# Patient Record
Sex: Female | Born: 1965 | Race: Black or African American | Hispanic: No | Marital: Married | Smoking: Never smoker
Health system: Southern US, Community
[De-identification: ages and names within clinical notes are randomized; demographics above are authoritative.]

## PROBLEM LIST (undated history)

## (undated) DIAGNOSIS — K5909 Other constipation: Secondary | ICD-10-CM

## (undated) DIAGNOSIS — J45909 Unspecified asthma, uncomplicated: Secondary | ICD-10-CM

## (undated) HISTORY — PX: ABDOMINAL HYSTERECTOMY: SHX81

---

## 2021-01-03 ENCOUNTER — Ambulatory Visit (HOSPITAL_COMMUNITY)
Admission: EM | Admit: 2021-01-03 | Discharge: 2021-01-03 | Disposition: A | Discharge status: Home / Self Care | Payer: 59 | Attending: Emergency Medicine | Admitting: Emergency Medicine

## 2021-01-03 ENCOUNTER — Emergency Department (HOSPITAL_COMMUNITY): Payer: 59 | Admitting: Anesthesiology

## 2021-01-03 ENCOUNTER — Encounter (HOSPITAL_COMMUNITY): Payer: Self-pay

## 2021-01-03 ENCOUNTER — Other Ambulatory Visit: Payer: Self-pay

## 2021-01-03 ENCOUNTER — Emergency Department (HOSPITAL_COMMUNITY): Payer: 59

## 2021-01-03 ENCOUNTER — Encounter (HOSPITAL_COMMUNITY)
Admission: EM | Disposition: A | Discharge status: Home / Self Care | Payer: Self-pay | Source: Home / Self Care | Attending: Emergency Medicine

## 2021-01-03 DIAGNOSIS — K5900 Constipation, unspecified: Secondary | ICD-10-CM | POA: Insufficient documentation

## 2021-01-03 DIAGNOSIS — K8012 Calculus of gallbladder with acute and chronic cholecystitis without obstruction: Secondary | ICD-10-CM | POA: Insufficient documentation

## 2021-01-03 DIAGNOSIS — R1011 Right upper quadrant pain: Secondary | ICD-10-CM

## 2021-01-03 DIAGNOSIS — Z79899 Other long term (current) drug therapy: Secondary | ICD-10-CM | POA: Insufficient documentation

## 2021-01-03 DIAGNOSIS — J45909 Unspecified asthma, uncomplicated: Secondary | ICD-10-CM | POA: Insufficient documentation

## 2021-01-03 DIAGNOSIS — K8000 Calculus of gallbladder with acute cholecystitis without obstruction: Secondary | ICD-10-CM | POA: Insufficient documentation

## 2021-01-03 DIAGNOSIS — Z20822 Contact with and (suspected) exposure to covid-19: Secondary | ICD-10-CM | POA: Diagnosis not present

## 2021-01-03 HISTORY — PX: CHOLECYSTECTOMY: SHX55

## 2021-01-03 HISTORY — DX: Unspecified asthma, uncomplicated: J45.909

## 2021-01-03 HISTORY — DX: Other constipation: K59.09

## 2021-01-03 LAB — URINALYSIS, ROUTINE W REFLEX MICROSCOPIC
Bilirubin Urine: NEGATIVE
Glucose, UA: NEGATIVE mg/dL
Ketones, ur: NEGATIVE mg/dL
Leukocytes,Ua: NEGATIVE
Nitrite: NEGATIVE
Protein, ur: NEGATIVE mg/dL
Specific Gravity, Urine: 1.015 (ref 1.005–1.030)
pH: 7 (ref 5.0–8.0)

## 2021-01-03 LAB — COMPREHENSIVE METABOLIC PANEL
ALT: 15 U/L (ref 0–44)
AST: 17 U/L (ref 15–41)
Albumin: 4.1 g/dL (ref 3.5–5.0)
Alkaline Phosphatase: 136 U/L — ABNORMAL HIGH (ref 38–126)
Anion gap: 8 (ref 5–15)
BUN: 17 mg/dL (ref 6–20)
CO2: 26 mmol/L (ref 22–32)
Calcium: 8.9 mg/dL (ref 8.9–10.3)
Chloride: 102 mmol/L (ref 98–111)
Creatinine, Ser: 0.62 mg/dL (ref 0.44–1.00)
GFR, Estimated: >60 mL/min (ref >60)
Glucose, Bld: 144 mg/dL — ABNORMAL HIGH (ref 70–99)
Potassium: 3.7 mmol/L (ref 3.5–5.1)
Sodium: 136 mmol/L (ref 135–145)
Total Bilirubin: 0.4 mg/dL (ref 0.3–1.2)
Total Protein: 8 g/dL (ref 6.5–8.1)

## 2021-01-03 LAB — CBC WITH DIFFERENTIAL/PLATELET
Abs Immature Granulocytes: 0.1 10*3/uL — ABNORMAL HIGH (ref 0.00–0.07)
Basophils Absolute: 0.1 10*3/uL (ref 0.0–0.1)
Basophils Relative: 1 %
Eosinophils Absolute: 0.2 10*3/uL (ref 0.0–0.5)
Eosinophils Relative: 1 %
HCT: 41.2 % (ref 36.0–46.0)
Hemoglobin: 13.5 g/dL (ref 12.0–15.0)
Immature Granulocytes: 1 %
Lymphocytes Relative: 18 %
Lymphs Abs: 2.9 10*3/uL (ref 0.7–4.0)
MCH: 30.1 pg (ref 26.0–34.0)
MCHC: 32.8 g/dL (ref 30.0–36.0)
MCV: 92 fL (ref 80.0–100.0)
Monocytes Absolute: 0.7 10*3/uL (ref 0.1–1.0)
Monocytes Relative: 5 %
Neutro Abs: 11.9 10*3/uL — ABNORMAL HIGH (ref 1.7–7.7)
Neutrophils Relative %: 74 %
Platelets: 315 10*3/uL (ref 150–400)
RBC: 4.48 MIL/uL (ref 3.87–5.11)
RDW: 12.6 % (ref 11.5–15.5)
WBC: 15.9 10*3/uL — ABNORMAL HIGH (ref 4.0–10.5)
nRBC: 0 % (ref 0.0–0.2)

## 2021-01-03 LAB — RESP PANEL BY RT-PCR (FLU A&B, COVID) ARPGX2
Influenza A by PCR: NEGATIVE
Influenza B by PCR: NEGATIVE
SARS Coronavirus 2 by RT PCR: NEGATIVE

## 2021-01-03 LAB — D-DIMER, QUANTITATIVE: D-Dimer, Quant: 0.49 ug/mL-FEU (ref 0.00–0.50)

## 2021-01-03 LAB — LIPASE, BLOOD: Lipase: 30 U/L (ref 11–51)

## 2021-01-03 LAB — LACTIC ACID, PLASMA: Lactic Acid, Venous: 1.7 mmol/L (ref 0.5–1.9)

## 2021-01-03 SURGERY — LAPAROSCOPIC CHOLECYSTECTOMY
Anesthesia: General

## 2021-01-03 MED ORDER — MIDAZOLAM HCL 5 MG/5ML IJ SOLN
INTRAMUSCULAR | Status: DC | PRN
  Administered 2021-01-03: 2 mg via INTRAVENOUS

## 2021-01-03 MED ORDER — OXYCODONE HCL 5 MG PO TABS: 5.0000 mg | ORAL_TABLET | ORAL | 0 refills | Status: DC | PRN

## 2021-01-03 MED ORDER — SODIUM CHLORIDE 0.9 % IR SOLN
Status: DC | PRN
  Administered 2021-01-03: 1000 mL

## 2021-01-03 MED ORDER — FENTANYL CITRATE (PF) 100 MCG/2ML IJ SOLN
50.0000 ug | Freq: Once | INTRAMUSCULAR | Status: AC
  Administered 2021-01-03: 50 ug via INTRAVENOUS
  Filled 2021-01-03: qty 2

## 2021-01-03 MED ORDER — ONDANSETRON HCL 4 MG/2ML IJ SOLN
INTRAMUSCULAR | Status: DC | PRN
  Administered 2021-01-03: 4 mg via INTRAVENOUS

## 2021-01-03 MED ORDER — SUCCINYLCHOLINE CHLORIDE 200 MG/10ML IV SOSY
PREFILLED_SYRINGE | INTRAVENOUS | Status: AC
  Filled 2021-01-03: qty 10

## 2021-01-03 MED ORDER — DEXAMETHASONE SODIUM PHOSPHATE 10 MG/ML IJ SOLN
INTRAMUSCULAR | Status: DC | PRN
  Administered 2021-01-03: 10 mg via INTRAVENOUS

## 2021-01-03 MED ORDER — BUPIVACAINE HCL (PF) 0.5 % IJ SOLN
INTRAMUSCULAR | Status: DC | PRN
  Administered 2021-01-03: 20 mL

## 2021-01-03 MED ORDER — SUCCINYLCHOLINE CHLORIDE 200 MG/10ML IV SOSY
PREFILLED_SYRINGE | INTRAVENOUS | Status: DC | PRN
  Administered 2021-01-03: 120 mg via INTRAVENOUS

## 2021-01-03 MED ORDER — SODIUM CHLORIDE 0.9 % IV BOLUS
1000.0000 mL | Freq: Once | INTRAVENOUS | Status: AC
  Administered 2021-01-03: 1000 mL via INTRAVENOUS

## 2021-01-03 MED ORDER — HEMOSTATIC AGENTS (NO CHARGE) OPTIME
TOPICAL | Status: DC | PRN
  Administered 2021-01-03: 1 via TOPICAL

## 2021-01-03 MED ORDER — ONDANSETRON HCL 4 MG/2ML IJ SOLN
4.0000 mg | Freq: Once | INTRAMUSCULAR | Status: AC | PRN
  Administered 2021-01-03: 4 mg via INTRAVENOUS
  Filled 2021-01-03: qty 2

## 2021-01-03 MED ORDER — HYDROMORPHONE HCL 1 MG/ML IJ SOLN
1.0000 mg | Freq: Once | INTRAMUSCULAR | Status: AC
  Administered 2021-01-03: 1 mg via INTRAVENOUS
  Filled 2021-01-03: qty 1

## 2021-01-03 MED ORDER — PROPOFOL 10 MG/ML IV BOLUS
INTRAVENOUS | Status: AC
  Filled 2021-01-03: qty 20

## 2021-01-03 MED ORDER — LIDOCAINE HCL (CARDIAC) PF 100 MG/5ML IV SOSY
PREFILLED_SYRINGE | INTRAVENOUS | Status: DC | PRN
  Administered 2021-01-03: 100 mg via INTRATRACHEAL

## 2021-01-03 MED ORDER — PROPOFOL 10 MG/ML IV BOLUS
INTRAVENOUS | Status: DC | PRN
  Administered 2021-01-03: 150 mg via INTRAVENOUS

## 2021-01-03 MED ORDER — SODIUM CHLORIDE 0.9 % IV SOLN
2.0000 g | INTRAVENOUS | Status: AC
  Administered 2021-01-03: 2 g via INTRAVENOUS
  Filled 2021-01-03: qty 2

## 2021-01-03 MED ORDER — ONDANSETRON HCL 4 MG PO TABS: 4.0000 mg | ORAL_TABLET | Freq: Three times a day (TID) | ORAL | 1 refills | Status: DC | PRN

## 2021-01-03 MED ORDER — FENTANYL CITRATE (PF) 100 MCG/2ML IJ SOLN
INTRAMUSCULAR | Status: AC
  Filled 2021-01-03: qty 6

## 2021-01-03 MED ORDER — BUPIVACAINE HCL (PF) 0.5 % IJ SOLN
INTRAMUSCULAR | Status: AC
  Filled 2021-01-03: qty 30

## 2021-01-03 MED ORDER — LIDOCAINE HCL (PF) 2 % IJ SOLN
INTRAMUSCULAR | Status: AC
  Filled 2021-01-03: qty 5

## 2021-01-03 MED ORDER — SUGAMMADEX SODIUM 200 MG/2ML IV SOLN
INTRAVENOUS | Status: DC | PRN
  Administered 2021-01-03: 350 mg via INTRAVENOUS

## 2021-01-03 MED ORDER — SODIUM CHLORIDE 0.9 % IV SOLN
INTRAVENOUS | Status: AC
  Filled 2021-01-03: qty 2

## 2021-01-03 MED ORDER — ROCURONIUM BROMIDE 10 MG/ML (PF) SYRINGE
PREFILLED_SYRINGE | INTRAVENOUS | Status: DC | PRN
  Administered 2021-01-03: 40 mg via INTRAVENOUS

## 2021-01-03 MED ORDER — ONDANSETRON HCL 4 MG/2ML IJ SOLN
4.0000 mg | Freq: Once | INTRAMUSCULAR | Status: AC
  Administered 2021-01-03: 4 mg via INTRAVENOUS
  Filled 2021-01-03: qty 2

## 2021-01-03 MED ORDER — DEXAMETHASONE SODIUM PHOSPHATE 10 MG/ML IJ SOLN
INTRAMUSCULAR | Status: AC
  Filled 2021-01-03: qty 1

## 2021-01-03 MED ORDER — ONDANSETRON HCL 4 MG/2ML IJ SOLN
INTRAMUSCULAR | Status: AC
  Filled 2021-01-03: qty 2

## 2021-01-03 MED ORDER — LACTATED RINGERS IV SOLN: INTRAVENOUS | Status: DC | PRN

## 2021-01-03 MED ORDER — CHLORHEXIDINE GLUCONATE CLOTH 2 % EX PADS: 6.0000 | MEDICATED_PAD | Freq: Once | CUTANEOUS | Status: DC

## 2021-01-03 MED ORDER — ROCURONIUM BROMIDE 10 MG/ML (PF) SYRINGE
PREFILLED_SYRINGE | INTRAVENOUS | Status: AC
  Filled 2021-01-03: qty 10

## 2021-01-03 MED ORDER — GLYCOPYRROLATE PF 0.2 MG/ML IJ SOSY
PREFILLED_SYRINGE | INTRAMUSCULAR | Status: DC | PRN
  Administered 2021-01-03: .2 mg via INTRAVENOUS

## 2021-01-03 MED ORDER — MIDAZOLAM HCL 2 MG/2ML IJ SOLN
INTRAMUSCULAR | Status: AC
  Filled 2021-01-03: qty 2

## 2021-01-03 MED ORDER — MEPERIDINE HCL 50 MG/ML IJ SOLN: 6.2500 mg | INTRAMUSCULAR | Status: DC | PRN

## 2021-01-03 MED ORDER — HYDROMORPHONE HCL 1 MG/ML IJ SOLN: 0.2500 mg | INTRAMUSCULAR | Status: DC | PRN

## 2021-01-03 MED ORDER — FENTANYL CITRATE (PF) 100 MCG/2ML IJ SOLN
INTRAMUSCULAR | Status: DC | PRN
  Administered 2021-01-03 (×4): 50 ug via INTRAVENOUS

## 2021-01-03 MED ORDER — KETOROLAC TROMETHAMINE 30 MG/ML IJ SOLN
30.0000 mg | Freq: Once | INTRAMUSCULAR | Status: AC
  Administered 2021-01-03: 30 mg via INTRAVENOUS
  Filled 2021-01-03: qty 1

## 2021-01-03 SURGICAL SUPPLY — 40 items
APPLICATOR ARISTA FLEXITIP XL (MISCELLANEOUS) ×2 IMPLANT
APPLIER CLIP ROT 10 11.4 M/L (STAPLE) ×2
BAG RETRIEVAL 10 (BASKET) ×1
BLADE SURG 15 STRL LF DISP TIS (BLADE) ×1 IMPLANT
BLADE SURG 15 STRL SS (BLADE) ×1
CHLORAPREP W/TINT 26 (MISCELLANEOUS) ×2 IMPLANT
CLIP APPLIE ROT 10 11.4 M/L (STAPLE) ×1 IMPLANT
CLOTH BEACON ORANGE TIMEOUT ST (SAFETY) ×2 IMPLANT
COVER LIGHT HANDLE STERIS (MISCELLANEOUS) ×4 IMPLANT
COVER WAND RF STERILE (DRAPES) ×2 IMPLANT
DECANTER SPIKE VIAL GLASS SM (MISCELLANEOUS) ×2 IMPLANT
DERMABOND ADVANCED (GAUZE/BANDAGES/DRESSINGS) ×1
DERMABOND ADVANCED .7 DNX12 (GAUZE/BANDAGES/DRESSINGS) ×1 IMPLANT
ELECT REM PT RETURN 9FT ADLT (ELECTROSURGICAL) ×2
ELECTRODE REM PT RTRN 9FT ADLT (ELECTROSURGICAL) ×1 IMPLANT
GLOVE SURG ENC MOIS LTX SZ6.5 (GLOVE) ×4 IMPLANT
GLOVE SURG UNDER POLY LF SZ6.5 (GLOVE) ×6 IMPLANT
GLOVE SURG UNDER POLY LF SZ7 (GLOVE) ×6 IMPLANT
GOWN STRL REUS W/TWL LRG LVL3 (GOWN DISPOSABLE) ×6 IMPLANT
HEMOSTAT ARISTA ABSORB 3G PWDR (HEMOSTASIS) ×2 IMPLANT
HEMOSTAT SNOW SURGICEL 2X4 (HEMOSTASIS) ×2 IMPLANT
INST SET LAPROSCOPIC AP (KITS) ×2 IMPLANT
KIT TURNOVER KIT A (KITS) ×2 IMPLANT
MANIFOLD NEPTUNE II (INSTRUMENTS) ×2 IMPLANT
NEEDLE INSUFFLATION 14GA 120MM (NEEDLE) ×2 IMPLANT
NS IRRIG 1000ML POUR BTL (IV SOLUTION) ×2 IMPLANT
PACK LAP CHOLE LZT030E (CUSTOM PROCEDURE TRAY) ×2 IMPLANT
PAD ARMBOARD 7.5X6 YLW CONV (MISCELLANEOUS) ×2 IMPLANT
SET BASIN LINEN APH (SET/KITS/TRAYS/PACK) ×2 IMPLANT
SET TUBE SMOKE EVAC HIGH FLOW (TUBING) ×2 IMPLANT
SLEEVE ENDOPATH XCEL 5M (ENDOMECHANICALS) ×2 IMPLANT
SUT MNCRL AB 4-0 PS2 18 (SUTURE) ×4 IMPLANT
SUT VICRYL 0 UR6 27IN ABS (SUTURE) ×2 IMPLANT
SYS BAG RETRIEVAL 10MM (BASKET) ×1
SYSTEM BAG RETRIEVAL 10MM (BASKET) ×1 IMPLANT
TROCAR ENDO BLADELESS 11MM (ENDOMECHANICALS) ×2 IMPLANT
TROCAR XCEL NON-BLD 5MMX100MML (ENDOMECHANICALS) ×2 IMPLANT
TROCAR XCEL UNIV SLVE 11M 100M (ENDOMECHANICALS) ×2 IMPLANT
TUBE CONNECTING 12X1/4 (SUCTIONS) ×2 IMPLANT
WARMER LAPAROSCOPE (MISCELLANEOUS) ×2 IMPLANT

## 2021-01-03 NOTE — Discharge Instructions (Signed)
Discharge Laparoscopic Surgery Instructions:  Common Complaints: Right shoulder pain is common after laparoscopic surgery. This is secondary to the gas used in the surgery being trapped under the diaphragm.  Walk to help your body absorb the gas. This will improve in a few days. Pain at the port sites are common, especially the larger port sites. This will improve with time.  Some nausea is common and poor appetite. The main goal is to stay hydrated the first few days after surgery.   Diet/ Activity: Diet as tolerated. You may not have an appetite, but it is important to stay hydrated. Drink 64 ounces of water a day. Your appetite will return with time.  Shower per your regular routine daily.  Do not take hot showers. Take warm showers that are less than 10 minutes. Rest and listen to your body, but do not remain in bed all day.  Walk everyday for at least 15-20 minutes. Deep cough and move around every 1-2 hours in the first few days after surgery.  Do not lift > 10 lbs, perform excessive bending, pushing, pulling, squatting for 1-2 weeks after surgery.  Do not pick at the dermabond glue on your incision sites.  This glue film will remain in place for 1-2 weeks and will start to peel off.  Do not place lotions or balms on your incision unless instructed to specifically by Dr. Ladale Sherburn.   Pain Expectations and Narcotics: -After surgery you will have pain associated with your incisions and this is normal. The pain is muscular and nerve pain, and will get better with time. -You are encouraged and expected to take non narcotic medications like tylenol and ibuprofen (when able) to treat pain as multiple modalities can aid with pain treatment. -Narcotics are only used when pain is severe or there is breakthrough pain. -You are not expected to have a pain score of 0 after surgery, as we cannot prevent pain. A pain score of 3-4 that allows you to be functional, move, walk, and tolerate some activity is  the goal. The pain will continue to improve over the days after surgery and is dependent on your surgery. -Due to [place] law, we are only able to give a certain amount of pain medication to treat post operative pain, and we only give additional narcotics on a patient by patient basis.  -For most laparoscopic surgery, studies have shown that the majority of patients only need 10-15 narcotic pills, and for open surgeries most patients only need 15-20.   -Having appropriate expectations of pain and knowledge of pain management with non narcotics is important as we do not want anyone to become addicted to narcotic pain medication.  -Using ice packs in the first 48 hours and heating pads after 48 hours, wearing an abdominal binder (when recommended), and using over the counter medications are all ways to help with pain management.   -Simple acts like meditation and mindfulness practices after surgery can also help with pain control and research has proven the benefit of these practices.  Medication: Take tylenol and ibuprofen as needed for pain control, alternating every 4-6 hours.  Example:  Tylenol 1000mg @ 6am, 12noon, 6pm, 12midnight (Do not exceed 4000mg of tylenol a day). Ibuprofen 800mg @ 9am, 3pm, 9pm, 3am (Do not exceed 3600mg of ibuprofen a day).  Take Roxicodone for breakthrough pain every 4 hours.  Take Colace for constipation related to narcotic pain medication. If you do not have a bowel movement in 2 days, take Miralax   over the counter.  Drink plenty of water to also prevent constipation.   Contact Information: If you have questions or concerns, please call our office, [phone], Monday- Thursday 8AM-5PM and Friday 8AM-12Noon.  If it is after hours or on the weekend, please call [facility], [phone], [phone], and ask to speak to the surgeon on call for Dr. Henreitta Leber at [facility].    Minimally Invasive Cholecystectomy, Care After This sheet gives you information about how  to care for yourself after your procedure. Your doctor may also give you more specific instructions. If you have problems or questions, contact your doctor. What can I expect after the procedure? After the procedure, it is common:  To have pain at the areas of surgery. You will be given medicines for pain.  To vomit or feel like you may vomit.  To feel fullness in the belly (bloating) or to have pain in the shoulder. This comes from the gas that was used during the surgery. Follow these instructions at home: Medicines  Take over-the-counter and prescription medicines only as told by your doctor.  If you were prescribed an antibiotic medicine, take it as told by your doctor. Do not stop using the antibiotic even if you start to feel better.  Ask your doctor if the medicine prescribed to you: ? Requires you to avoid driving or using machinery. ? Can cause trouble pooping (constipation). You may need to take these actions to prevent or treat trouble pooping:  Drink enough fluid to keep your pee (urine) pale yellow.  Take over-the-counter or prescription medicines.  Eat foods that are high in fiber. These include beans, whole grains, and fresh fruits and vegetables.  Limit foods that are high in fat and sugar. These include fried or sweet foods. Incision care  Follow instructions from your doctor about how to take care of your cuts from surgery (incisions). Make sure you: ? Wash your hands with soap and water for at least 20 seconds before and after you change your bandage (dressing). If you cannot use soap and water, use hand sanitizer. ? Change your bandage as told by your doctor. ? Leave stitches (sutures), skin glue, or skin tape (adhesive) strips in place. They may need to stay in place for 2 weeks or longer. If tape strips get loose and curl up, you may trim the loose edges. Do not remove tape strips completely unless your doctor says it is okay.  Do not take baths, swim, or use a  hot tub until your doctor approves. You may shower.  Check your surgery area every day for signs of infection. Check for: ? More redness, swelling, or pain. ? Fluid or blood. ? Warmth. ? Pus or a bad smell.   Activity  Rest as told by your doctor.  Do not sit for a long time without moving. Get up to take short walks every 1-2 hours. This is important. Ask for help if you feel weak or unsteady.  Do not lift anything that is heavier than 10 lb (4.5 kg), or the limit that you are told, until your doctor says that it is safe.  Do not play contact sports until your doctor says it is okay.  Do not return to work or school until your doctor says it is okay.  Return to your normal activities as told by your doctor. Ask your doctor what activities are safe for you. General instructions  If you were given a medicine to help you relax (sedative)  during your procedure, it can affect you for many hours. Do not drive or use machinery until your doctor says that it is safe.  Keep all follow-up visits as told by your doctor. This is important. Contact a doctor if:  You get a rash.  You have more redness, swelling, or pain around your cuts from surgery.  You have fluid or blood coming from your cuts from surgery.  Your cuts from surgery feel warm to the touch.  You have pus or a bad smell coming from your cuts from surgery.  You have a fever.  One or more of your cuts from surgery breaks open. Get help right away if:  You have trouble breathing.  You have chest pain.  You have pain that is getting worse in your shoulders.  You faint or feel dizzy when you stand.  You have very bad pain in your belly (abdomen).  You feel like you may vomit or you vomit, and this lasts for more than one day.  You have leg pain. Summary  After your surgery, it is common to have pain at the areas of surgery. You may also have vomiting or fullness in the belly.  Follow your doctor's instructions  about medicine, activity restrictions, and caring for your surgery areas. Do not do activities that require a lot of effort.  Contact a doctor if you have a fever or other signs of infection, such as more redness, swelling, or pain around the cuts from surgery.  Get help right away if you have chest pain, increasing pain in the shoulders, or trouble breathing. This information is not intended to replace advice given to you by your health care provider. Make sure you discuss any questions you have with your health care provider. Document Revised: 07/09/2019 Document Reviewed: 07/09/2019 Elsevier Patient Education  2021 [employer].

## 2021-01-03 NOTE — Anesthesia Procedure Notes (Signed)
Procedure Name: Intubation Date/Time: 01/03/2021 1:31 PM Performed by: Denese Killings, MD Pre-anesthesia Checklist: Patient identified, Emergency Drugs available, Suction available and Patient being monitored Patient Re-evaluated:Patient Re-evaluated prior to induction Oxygen Delivery Method: Circle system utilized Preoxygenation: Pre-oxygenation with 100% oxygen Induction Type: IV induction and Rapid sequence Laryngoscope Size: Mac and 3 Grade View: Grade II Tube type: Oral Tube size: 7.0 mm Number of attempts: 1 Airway Equipment and Method: Stylet and Oral airway Placement Confirmation: ETT inserted through vocal cords under direct vision,  positive ETCO2 and breath sounds checked- equal and bilateral Secured at: 22 cm Tube secured with: Tape Dental Injury: Teeth and Oropharynx as per pre-operative assessment  Comments: Looks like she has enlarged tonsils.

## 2021-01-03 NOTE — Transfer of Care (Signed)
Immediate Anesthesia Transfer of Care Note  Patient: Laurie Brooks  Procedure(s) Performed: LAPAROSCOPIC CHOLECYSTECTOMY (N/A )  Patient Location: PACU  Anesthesia Type:General  Level of Consciousness: awake, alert , oriented and sedated  Airway & Oxygen Therapy: Patient Spontanous Breathing and Patient connected to face mask oxygen  Post-op Assessment: Report given to RN and Post -op Vital signs reviewed and stable  Post vital signs: Reviewed and stable  Last Vitals:  Vitals Value Taken Time  BP 124/73 01/03/21 1437  Temp 36.4 C 01/03/21 1436  Pulse 92 01/03/21 1442  Resp 15 01/03/21 1442  SpO2 100 % 01/03/21 1442  Vitals shown include unvalidated device data.  Last Pain:  Vitals:   01/03/21 1054  TempSrc:   PainSc: Asleep         Complications: No complications documented.

## 2021-01-03 NOTE — Op Note (Signed)
Operative Note   Preoperative Diagnosis: Gallstones with acute cholecystitis    Postoperative Diagnosis: Same   Procedure(s) Performed: Laparoscopic cholecystectomy   Surgeon: Lillia Abed C. Henreitta Leber, MD   Assistants: No qualified resident was available   Anesthesia: General endotracheal   Anesthesiologist: Molli Barrows, MD    Specimens: Gallbladder    Estimated Blood Loss: Minimal    Blood Replacement: None    Complications: None    Operative Findings: Distended gallbladder with edema and inflammation consistent with cholecystitis    Procedure: The patient was taken to the operating room and placed supine. General endotracheal anesthesia was induced. Intravenous antibiotics were administered per protocol. An orogastric tube positioned to decompress the stomach. The abdomen was prepared and draped in the usual sterile fashion.    A supraumbilical incision was made and a Veress technique was utilized to achieve pneumoperitoneum to 15 mmHg with carbon dioxide. A 11 mm optiview port was placed through the supraumbilical region, and a 10 mm 0-degree operative laparoscope was introduced. The area underlying the trocar and Veress needle were inspected and without evidence of injury.  Remaining trocars were placed under direct vision. Two 5 mm ports were placed in the right abdomen, between the anterior axillary and midclavicular line.  A final 11 mm port was placed through the mid-epigastrium, near the falciform ligament.    The gallbladder fundus was elevated cephalad and the infundibulum was retracted to the patient's right. The gallbladder/cystic duct junction was skeletonized. The cystic artery noted in the triangle of Calot and was also skeletonized.  We then continued liberal medial and lateral dissection until the critical view of safety was achieved.    The cystic duct and cystic artery were triply clipped and divided. The gallbladder was then dissected from the liver bed with  electrocautery. The specimen was placed in an Endopouch and was retrieved through the epigastric site.   Final inspection revealed acceptable hemostasis. Arista and Surgical SNOW was placed in the gallbladder bed.  Trocars were removed and pneumoperitoneum was released.  0 Vicryl fascial sutures were used to close the epigastric and umbilical port sites. Skin incisions were closed with 4-0 Monocryl subcuticular sutures and Dermabond. The patient was awakened from anesthesia and extubated without complication.    Algis Greenhouse, MD [facility] [address] [place], [place] [address] [phone] (office)

## 2021-01-03 NOTE — ED Provider Notes (Signed)
Blood pressure 130/65, pulse 60, temperature 98.2 F (36.8 C), temperature source Oral, resp. rate 13, height 5\' 3"  (1.6 m), weight 87.1 kg, SpO2 94 %.  Assuming care from Dr. .  In short, Laurie Brooks is a 55 y.o. female with a chief complaint of Abdominal Pain (Abd pain radiating to back on R side with n/v) .  Refer to the original H&P for additional details.  The current plan of care is to follow up on RUQ 44.  10:59 AM  The patient's ultrasound is resulted.  There is cholelithiasis with some mild gallbladder wall thickening.  Patient has a sonographic Murphy sign on exam.  There is no pericholecystic fluid.  Overall, scan is equivocal.  She does have a leukocytosis to 15.9 with normal LFTs and bilirubin.  Patient states her pain has reduced "from a 15 to 9/10" currently. Will discuss with general surgery.   Discussed patient's case with Surgery, Dr. [place] to request admission. Patient and family (if present) updated with plan. Care transferred to Surgery service.  I reviewed all nursing notes, vitals, pertinent old records, EKGs, labs, imaging (as available).     Henreitta Leber, MD 01/03/21 1209

## 2021-01-03 NOTE — ED Provider Notes (Signed)
[facility] EMERGENCY DEPARTMENT Provider Note   CSN: [id number] Arrival date & time: 01/03/21  0400     History Chief Complaint  Patient presents with  . Abdominal Pain    Abd pain radiating to back on R side with n/v    Laurie Brooks is a 55 y.o. female.  Patient with severe right-sided abdominal pain with nausea and vomiting that onset around midnight.  States she had a normal day yesterday without issue.  Suddenly had right-sided abdominal pain and back pain that has been progressively worsening and constant.  Associate multiple episodes of nausea and vomiting has been nonbilious and nonbloody.  Last bowel movement was earlier today and normal.  No pain with urination or blood in the urine.  No chest pain or shortness of breath.  She is never had this pain in the past.  Does have a history of acid reflux but this does not feel like that.  Still has appendix and gallbladder.  Had hysterectomy and 1 ovary remaining.  The history is provided by the patient.  Abdominal Pain Associated symptoms: nausea and vomiting   Associated symptoms: no chest pain, no cough, no dysuria, no fever, no hematuria and no shortness of breath        Past Medical History:  Diagnosis Date  . Asthma   . Chronic constipation     There are no problems to display for this patient.      OB History   No obstetric history on file.     No family history on file.  Social History   Tobacco Use  . Smoking status: Never Smoker  . Smokeless tobacco: Never Used  Vaping Use  . Vaping Use: Never used  Substance Use Topics  . Alcohol use: Yes    Comment: on occasion  . Drug use: Never    Home Medications Prior to Admission medications   Not on File    Allergies    Patient has no known allergies.  Review of Systems   Review of Systems  Constitutional: Positive for activity change and appetite change. Negative for fever.  HENT: Negative for congestion and rhinorrhea.   Respiratory:  Negative for cough, chest tightness and shortness of breath.   Cardiovascular: Negative for chest pain.  Gastrointestinal: Positive for abdominal pain, nausea and vomiting.  Genitourinary: Negative for dysuria and hematuria.  Musculoskeletal: Positive for back pain.  Skin: Negative for rash.  Neurological: Negative for dizziness, weakness and headaches.   all other systems are negative except as noted in the HPI and PMH.    Physical Exam Updated Vital Signs BP (!) 163/77 (BP Location: Left Arm)   Pulse (!) 58   Temp 98.2 F (36.8 C) (Oral)   Resp 20   Ht 5\' 3"  (1.6 m)   Wt 87.1 kg   SpO2 100%   BMI 34.01 kg/m   Physical Exam Vitals and nursing note reviewed.  Constitutional:      General: She is in acute distress.     Appearance: She is well-developed.     Comments: uncomfortable  HENT:     Head: Normocephalic and atraumatic.     Mouth/Throat:     Pharynx: No oropharyngeal exudate.  Eyes:     Conjunctiva/sclera: Conjunctivae normal.     Pupils: Pupils are equal, round, and reactive to light.  Neck:     Comments: No meningismus. Cardiovascular:     Rate and Rhythm: Normal rate and regular rhythm.  Heart sounds: Normal heart sounds. No murmur heard.   Pulmonary:     Effort: Pulmonary effort is normal. No respiratory distress.     Breath sounds: Normal breath sounds.  Abdominal:     Palpations: Abdomen is soft.     Tenderness: There is abdominal tenderness. There is guarding. There is no rebound.     Comments: Right upper quadrant and epigastric tenderness with voluntary guarding Lower abdomen soft and nontender  Musculoskeletal:        General: Tenderness present. Normal range of motion.     Cervical back: Normal range of motion and neck supple.     Comments: R paraspinal lumbar tenderness  Skin:    General: Skin is warm.     Capillary Refill: Capillary refill takes less than 2 seconds.  Neurological:     General: No focal deficit present.     Mental  Status: She is alert and oriented to person, place, and time. Mental status is at baseline.     Cranial Nerves: No cranial nerve deficit.     Motor: No abnormal muscle tone.     Coordination: Coordination normal.     Comments: No ataxia on finger to nose bilaterally. No pronator drift. 5/5 strength throughout. CN 2-12 intact.Equal grip strength. Sensation intact.   Psychiatric:        Behavior: Behavior normal.     ED Results / Procedures / Treatments   Labs (all labs ordered are listed, but only abnormal results are displayed) Labs Reviewed  CBC WITH DIFFERENTIAL/PLATELET - Abnormal; Notable for the following components:      Result Value   WBC 15.9 (*)    Neutro Abs 11.9 (*)    Abs Immature Granulocytes 0.10 (*)    All other components within normal limits  COMPREHENSIVE METABOLIC PANEL - Abnormal; Notable for the following components:   Glucose, Bld 144 (*)    Alkaline Phosphatase 136 (*)    All other components within normal limits  LIPASE, BLOOD  LACTIC ACID, PLASMA  D-DIMER, QUANTITATIVE  URINALYSIS, ROUTINE W REFLEX MICROSCOPIC    EKG EKG Interpretation  Date/Time:  Sunday Jan 03 2021 06:52:17 EDT Ventricular Rate:  65 PR Interval:  165 QRS Duration: 92 QT Interval:  424 QTC Calculation: 441 R Axis:   -2 Text Interpretation: Sinus rhythm Low voltage, precordial leads No previous ECGs available Confirmed by Glynn Octave [phone]) on 01/03/2021 7:03:57 AM   Radiology CT ABDOMEN PELVIS WO CONTRAST  Result Date: 01/03/2021 CLINICAL DATA:  Right upper quadrant abdominal pain EXAM: CT ABDOMEN AND PELVIS WITHOUT CONTRAST TECHNIQUE: Multidetector CT imaging of the abdomen and pelvis was performed following the standard protocol without IV contrast. COMPARISON:  None. FINDINGS: Lower chest: Lung bases are clear. Hepatobiliary: Unenhanced liver is unremarkable. Mild gallbladder sludge. No calcified gallstones or associated pericholecystic fluid/inflammatory changes. No  intrahepatic or extrahepatic ductal dilatation. Pancreas: Within normal limits. Spleen: Within normal limits. Adrenals/Urinary Tract: Adrenal glands are within normal limits. Kidneys are within normal limits. No renal, ureteral, or bladder calculi. No hydronephrosis. Low-lying bladder, within normal limits. Stomach/Bowel: Stomach is within normal limits. No evidence of bowel obstruction. Normal appendix (series 2/image 62). Mild sigmoid diverticulosis, without evidence of diverticulitis. Vascular/Lymphatic: No evidence of abdominal aortic aneurysm. No suspicious abdominopelvic lymphadenopathy. Reproductive: Status post hysterectomy. Bilateral ovaries are within normal limits. Other: No abdominopelvic ascites. Musculoskeletal: Visualized osseous structures are within normal limits. IMPRESSION: Mild gallbladder sludge, without associated inflammatory changes. No CT findings to account for the patient's right  abdominal pain. Additional ancillary findings as above. Electronically Signed   By: Charline Bills M.D.   On: 01/03/2021 06:28   DG Chest Portable 1 View  Result Date: 01/03/2021 CLINICAL DATA:  Right upper quadrant pain EXAM: PORTABLE CHEST 1 VIEW COMPARISON:  Abdominal CT from earlier the same day FINDINGS: Cardiomegaly accentuated by low volumes. Negative aortic and hilar contours. There is no edema, consolidation, effusion, or pneumothorax. Artifact from EKG leads. IMPRESSION: Enlarged heart size accentuated by low volumes. Electronically Signed   By: Marnee Spring M.D.   On: 01/03/2021 07:25    Procedures Procedures   Medications Ordered in ED Medications  sodium chloride 0.9 % bolus 1,000 mL (has no administration in time range)  ondansetron (ZOFRAN) injection 4 mg (has no administration in time range)  fentaNYL (SUBLIMAZE) injection 50 mcg (has no administration in time range)    ED Course  I have reviewed the triage vital signs and the nursing notes.  Pertinent labs & imaging  results that were available during my care of the patient were reviewed by me and considered in my medical decision making (see chart for details).    MDM Rules/Calculators/A&P                         Right-sided abdominal pain with nausea and vomiting.  Vitals are stable.  Concern for possible cholecystitis or pancreatitis.  Patient hydrated and given pain and nausea medications.  Labs show leukocytosis of 16.  However LFTs and lipase are normal.  Ultrasounds not available.  CT scan was performed which shows gallbladder sludge without significant inflammatory changes around the gallbladder.  Patient still very uncomfortable with right upper quadrant and mid back pain.  Has required multiple doses of pain medication.  Still with Severe pain after multiple doses of fentanyl.  Did receive pain relief with Dilaudid but became somnolent and dropped her oxygenation level to the mid 80s requiring 2 L of oxygen.  EKG is sinus rhythm without acute ischemia.  D-dimer is negative.  Chest x-ray is negative for infiltrate. Equal upper extremity blood pressures bilaterally. Low suspicion for ACS, pulm embolism, aortic dissection. Patient was not hypoxic until she received Dilaudid.  Patient feeling improved.  Plan will be to obtain ultrasound when they come on shift later this morning. Patient will need reevaluation and possible surgical consultation pending ultrasound results. Dr Jacqulyn Bath to assume care at shift change.   Final Clinical Impression(s) / ED Diagnoses Final diagnoses:  RUQ pain    Rx / DC Orders ED Discharge Orders    None       Bailie Christenbury, Jeannett Senior, MD 01/03/21 [phone]

## 2021-01-03 NOTE — Anesthesia Preprocedure Evaluation (Signed)
Anesthesia Evaluation  Patient identified by MRN, date of birth, ID band Patient awake    Reviewed: Allergy & Precautions, NPO status , Patient's Chart, lab work & pertinent test results  Airway Mallampati: II  TM Distance: >3 FB Neck ROM: Full    Dental  (+) Dental Advisory Given, Teeth Intact   Pulmonary asthma ,    Pulmonary exam normal breath sounds clear to auscultation       Cardiovascular Exercise Tolerance: Good Normal cardiovascular exam Rhythm:Regular Rate:Normal     Neuro/Psych negative neurological ROS     GI/Hepatic negative GI ROS, Neg liver ROS,   Endo/Other  negative endocrine ROS  Renal/GU negative Renal ROS     Musculoskeletal negative musculoskeletal ROS (+)   Abdominal   Peds  Hematology negative hematology ROS (+)   Anesthesia Other Findings   Reproductive/Obstetrics negative OB ROS                             Anesthesia Physical Anesthesia Plan  ASA: II and emergent  Anesthesia Plan: General   Post-op Pain Management:    Induction: Intravenous  PONV Risk Score and Plan: 4 or greater and Ondansetron, Midazolam and Dexamethasone  Airway Management Planned: Oral ETT  Additional Equipment:   Intra-op Plan:   Post-operative Plan: Extubation in OR  Informed Consent: I have reviewed the patients History and Physical, chart, labs and discussed the procedure including the risks, benefits and alternatives for the proposed anesthesia with the patient or authorized representative who has indicated his/her understanding and acceptance.       Plan Discussed with: CRNA and Surgeon  Anesthesia Plan Comments:         Anesthesia Quick Evaluation

## 2021-01-03 NOTE — ED Notes (Signed)
Pt up to bathroom and back to bed, urine specimen obtained sent to lab

## 2021-01-03 NOTE — H&P (Signed)
[facility] History and Physical  Reason for Referral: Acute cholecystitis  Referring Physician:  Dr. Jacqulyn Bath   Chief Complaint    Abdominal Pain      Laurie Brooks is a 55 y.o. female.  HPI:  Laurie Brooks is a 55 yo with constipation on Linzess and asthma. She comes in with RUQ pain and associated nausea and vomiting since midnight last night. The pain is constant and sharp in nature. She has never had anything like this prior. She has as leukocytosis in the ED and CT with sludge of the gallbladder and [place] with some stones and Murphy's sign. She is tender in the RUQ.    Past Medical History:  Diagnosis Date  . Asthma   . Chronic constipation     Past Surgical History:  Procedure Laterality Date  . ABDOMINAL HYSTERECTOMY      Family History  Problem Relation Age of Onset  . Heart disease Mother     Social History   Tobacco Use  . Smoking status: Never Smoker  . Smokeless tobacco: Never Used  Vaping Use  . Vaping Use: Never used  Substance Use Topics  . Alcohol use: Yes    Comment: on occasion  . Drug use: Never    Medications: I have reviewed the patient's current medications. Current Facility-Administered Medications  Medication Dose Route Frequency Provider Last Rate Last Admin  . [START ON 01/04/2021] cefoTEtan (CEFOTAN) 2 g in sodium chloride 0.9 % 100 mL IVPB  2 g Intravenous On Call to OR Lucretia Roers, MD      . Chlorhexidine Gluconate Cloth 2 % PADS 6 each  6 each Topical Once Lucretia Roers, MD       No current outpatient medications on file.  Linzess  Albuterol as needed   No Known Allergies   ROS:  A comprehensive review of systems was negative except for: Gastrointestinal: positive for abdominal pain, nausea and vomiting  Blood pressure (!) 142/77, pulse (!) 49, temperature 98.2 F (36.8 C), temperature source Oral, resp. rate 15, height 5\' 3"  (1.6 m), weight 87.1 kg, SpO2 100 %. Physical Exam Vitals reviewed.   Constitutional:      Appearance: She is well-developed.  HENT:     Head: Normocephalic.  Eyes:     Extraocular Movements: Extraocular movements intact.  Cardiovascular:     Rate and Rhythm: Normal rate.  Pulmonary:     Breath sounds: Normal breath sounds.  Abdominal:     General: There is no distension.     Palpations: Abdomen is soft.     Tenderness: There is abdominal tenderness in the right upper quadrant and epigastric area.     Hernia: No hernia is present.  Musculoskeletal:     Comments: Moves all extremities   Skin:    General: Skin is warm and dry.  Neurological:     General: No focal deficit present.     Mental Status: She is alert and oriented to person, place, and time.  Psychiatric:        Mood and Affect: Mood normal.        Behavior: Behavior normal.     Results: Results for orders placed or performed during the hospital encounter of 01/03/21 (from the past 48 hour(s))  CBC with Differential/Platelet     Status: Abnormal   Collection Time: 01/03/21  4:36 AM  Result Value Ref Range   WBC 15.9 (H) 4.0 - 10.5 K/uL   RBC 4.48 3.87 -  5.11 MIL/uL   Hemoglobin 13.5 12.0 - 15.0 g/dL   HCT 20.9 47.0 - 96.2 %   MCV 92.0 80.0 - 100.0 fL   MCH 30.1 26.0 - 34.0 pg   MCHC 32.8 30.0 - 36.0 g/dL   RDW 83.6 62.9 - 47.6 %   Platelets 315 150 - 400 K/uL   nRBC 0.0 0.0 - 0.2 %   Neutrophils Relative % 74 %   Neutro Abs 11.9 (H) 1.7 - 7.7 K/uL   Lymphocytes Relative 18 %   Lymphs Abs 2.9 0.7 - 4.0 K/uL   Monocytes Relative 5 %   Monocytes Absolute 0.7 0.1 - 1.0 K/uL   Eosinophils Relative 1 %   Eosinophils Absolute 0.2 0.0 - 0.5 K/uL   Basophils Relative 1 %   Basophils Absolute 0.1 0.0 - 0.1 K/uL   Immature Granulocytes 1 %   Abs Immature Granulocytes 0.10 (H) 0.00 - 0.07 K/uL    Comment: Performed at [facility], [address] [place], [place] [address]  Comprehensive metabolic panel     Status: Abnormal   Collection Time: 01/03/21  4:36 AM  Result Value Ref  Range   Sodium 136 135 - 145 mmol/L   Potassium 3.7 3.5 - 5.1 mmol/L   Chloride 102 98 - 111 mmol/L   CO2 26 22 - 32 mmol/L   Glucose, Bld 144 (H) 70 - 99 mg/dL    Comment: Glucose reference range applies only to samples taken after fasting for at least 8 hours.   BUN 17 6 - 20 mg/dL   Creatinine, Ser 3.54 0.44 - 1.00 mg/dL   Calcium 8.9 8.9 - 65.6 mg/dL   Total Protein 8.0 6.5 - 8.1 g/dL   Albumin 4.1 3.5 - 5.0 g/dL   AST 17 15 - 41 U/L   ALT 15 0 - 44 U/L   Alkaline Phosphatase 136 (H) 38 - 126 U/L   Total Bilirubin 0.4 0.3 - 1.2 mg/dL   GFR, Estimated >81 >27 mL/min    Comment: (NOTE) Calculated using the CKD-EPI Creatinine Equation (2021)    Anion gap 8 5 - 15    Comment: Performed at [facility], [address] [place], [place] [address]  Lipase, blood     Status: None   Collection Time: 01/03/21  4:36 AM  Result Value Ref Range   Lipase 30 11 - 51 U/L    Comment: Performed at [facility], [address] [place], [place] [address]  Lactic acid, plasma     Status: None   Collection Time: 01/03/21  4:36 AM  Result Value Ref Range   Lactic Acid, Venous 1.7 0.5 - 1.9 mmol/L    Comment: Performed at [facility], [address] [place], [place] [address]  Urinalysis, Routine w reflex microscopic     Status: Abnormal   Collection Time: 01/03/21  4:36 AM  Result Value Ref Range   Color, Urine STRAW (A) YELLOW   APPearance HAZY (A) CLEAR   Specific Gravity, Urine 1.015 1.005 - 1.030   pH 7.0 5.0 - 8.0   Glucose, UA NEGATIVE NEGATIVE mg/dL   Hgb urine dipstick SMALL (A) NEGATIVE   Bilirubin Urine NEGATIVE NEGATIVE   Ketones, ur NEGATIVE NEGATIVE mg/dL   Protein, ur NEGATIVE NEGATIVE mg/dL   Nitrite NEGATIVE NEGATIVE   Leukocytes,Ua NEGATIVE NEGATIVE   RBC / HPF 0-5 0 - 5 RBC/hpf   WBC, UA 0-5 0 - 5 WBC/hpf   Bacteria, UA RARE (A) NONE SEEN  Squamous Epithelial / LPF 0-5 0 - 5    Comment: Performed at [facility], [address] [place], [place] [address]  D-dimer,  quantitative     Status: None   Collection Time: 01/03/21  6:30 AM  Result Value Ref Range   D-Dimer, Quant 0.49 0.00 - 0.50 ug/mL-FEU    Comment: (NOTE) At the manufacturer cut-off value of 0.5 g/mL FEU, this assay has a negative predictive value of 95-100%.This assay is intended for use in conjunction with a clinical pretest probability (PTP) assessment model to exclude pulmonary embolism (PE) and deep venous thrombosis (DVT) in outpatients suspected of PE or DVT. Results should be correlated with clinical presentation. Performed at [facility], [address] [place], [place] [address]   Resp Panel by RT-PCR (Flu A&B, Covid) Nasopharyngeal Swab     Status: None   Collection Time: 01/03/21 11:01 AM   Specimen: Nasopharyngeal Swab; Nasopharyngeal(NP) swabs in vial transport medium  Result Value Ref Range   SARS Coronavirus 2 by RT PCR NEGATIVE NEGATIVE    Comment: (NOTE) SARS-CoV-2 target nucleic acids are NOT DETECTED.  The SARS-CoV-2 RNA is generally detectable in upper respiratory specimens during the acute phase of infection. The lowest concentration of SARS-CoV-2 viral copies this assay can detect is 138 copies/mL. A negative result does not preclude SARS-Cov-2 infection and should not be used as the sole basis for treatment or other patient management decisions. A negative result may occur with  improper specimen collection/handling, submission of specimen other than nasopharyngeal swab, presence of viral mutation(s) within the areas targeted by this assay, and inadequate number of viral copies(<138 copies/mL). A negative result must be combined with clinical observations, patient history, and epidemiological information. The expected result is Negative.  Fact Sheet for Patients:  BloggerCourse.comhttps://www.fda.gov/media/152166/download  Fact Sheet for Healthcare Providers:  SeriousBroker.ithttps://www.fda.gov/media/152162/download  This test is no t yet approved or cleared by the [place] FDA  and  has been authorized for detection and/or diagnosis of SARS-CoV-2 by FDA under an Emergency Use Authorization (EUA). This EUA will remain  in effect (meaning this test can be used) for the duration of the COVID-19 declaration under Section 564(b)(1) of the Act, 21 U.S.C.section 360bbb-3(b)(1), unless the authorization is terminated  or revoked sooner.       Influenza A by PCR NEGATIVE NEGATIVE   Influenza B by PCR NEGATIVE NEGATIVE    Comment: (NOTE) The Xpert Xpress SARS-CoV-2/FLU/RSV plus assay is intended as an aid in the diagnosis of influenza from Nasopharyngeal swab specimens and should not be used as a sole basis for treatment. Nasal washings and aspirates are unacceptable for Xpert Xpress SARS-CoV-2/FLU/RSV testing.  Fact Sheet for Patients: BloggerCourse.comhttps://www.fda.gov/media/152166/download  Fact Sheet for Healthcare Providers: SeriousBroker.ithttps://www.fda.gov/media/152162/download  This test is not yet approved or cleared by the [place] FDA and has been authorized for detection and/or diagnosis of SARS-CoV-2 by FDA under an Emergency Use Authorization (EUA). This EUA will remain in effect (meaning this test can be used) for the duration of the COVID-19 declaration under Section 564(b)(1) of the Act, 21 U.S.C. section 360bbb-3(b)(1), unless the authorization is terminated or revoked.  Performed at [facility], [address] [place], [place] [address]    Personally reviewed- gallbladder with stones and but no major thickening, CBD wnl  CT ABDOMEN PELVIS WO CONTRAST  Result Date: 01/03/2021 CLINICAL DATA:  Right upper quadrant abdominal pain EXAM: CT ABDOMEN AND PELVIS WITHOUT CONTRAST TECHNIQUE: Multidetector CT imaging of the abdomen and pelvis was performed following the  standard protocol without IV contrast. COMPARISON:  None. FINDINGS: Lower chest: Lung bases are clear. Hepatobiliary: Unenhanced liver is unremarkable. Mild gallbladder sludge. No calcified gallstones or  associated pericholecystic fluid/inflammatory changes. No intrahepatic or extrahepatic ductal dilatation. Pancreas: Within normal limits. Spleen: Within normal limits. Adrenals/Urinary Tract: Adrenal glands are within normal limits. Kidneys are within normal limits. No renal, ureteral, or bladder calculi. No hydronephrosis. Low-lying bladder, within normal limits. Stomach/Bowel: Stomach is within normal limits. No evidence of bowel obstruction. Normal appendix (series 2/image 62). Mild sigmoid diverticulosis, without evidence of diverticulitis. Vascular/Lymphatic: No evidence of abdominal aortic aneurysm. No suspicious abdominopelvic lymphadenopathy. Reproductive: Status post hysterectomy. Bilateral ovaries are within normal limits. Other: No abdominopelvic ascites. Musculoskeletal: Visualized osseous structures are within normal limits. IMPRESSION: Mild gallbladder sludge, without associated inflammatory changes. No CT findings to account for the patient's right abdominal pain. Additional ancillary findings as above. Electronically Signed   By: Charline Bills M.D.   On: 01/03/2021 06:28   DG Chest Portable 1 View  Result Date: 01/03/2021 CLINICAL DATA:  Right upper quadrant pain EXAM: PORTABLE CHEST 1 VIEW COMPARISON:  Abdominal CT from earlier the same day FINDINGS: Cardiomegaly accentuated by low volumes. Negative aortic and hilar contours. There is no edema, consolidation, effusion, or pneumothorax. Artifact from EKG leads. IMPRESSION: Enlarged heart size accentuated by low volumes. Electronically Signed   By: Marnee Spring M.D.   On: 01/03/2021 07:25   US Abdomen Limited RUQ (LIVER/GB)  Result Date: 01/03/2021 CLINICAL DATA:  55 year old female with history of right upper quadrant abdominal pain. EXAM: ULTRASOUND ABDOMEN LIMITED RIGHT UPPER QUADRANT COMPARISON:  No priors. FINDINGS: Gallbladder: Multiple small echogenic foci with posterior acoustic shadowing, compatible with gallstones measuring up  to 1.9 cm in diameter. Gallbladder does not appear distended. Gallbladder wall is mildly thickened measuring up to 5 mm. No pericholecystic fluid. However, per report from the sonographer, there was a sonographic Murphy's sign on examination. Common bile duct: Diameter: 4.2 mm. Liver: No focal lesion identified. Within normal limits in parenchymal echogenicity. Portal vein is patent on color Doppler imaging with normal direction of blood flow towards the liver. Other: None. IMPRESSION: 1. Study is positive for cholelithiasis. There is some mild gallbladder wall thickening in the setting of a relatively under distended gallbladder, and per report from the sonographer there is a sonographic Murphy's sign. However, gallbladder is not distended and there is no pericholecystic fluid. Overall, findings are considered equivocal for acute cholecystitis. Further clinical evaluation is recommended. Electronically Signed   By: Trudie Reed M.D.   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 female with gallstones and likely acute cholecystitis based on tenderness and leukocytosis.  -PLAN: I counseled the patient about the indication, risks and benefits of laparoscopic cholecystectomy.  She understands there is a very small chance for bleeding, infection, injury to normal structures (including common bile duct), conversion to open surgery, persistent symptoms, evolution of postcholecystectomy diarrhea, need for secondary interventions, anesthesia reaction, cardiopulmonary issues and other risks not specifically detailed here. I described the expected recovery, the plan for follow-up and the restrictions during the recovery phase.  All questions were answered.  COVID negative May be able to get home post op   All questions were answered to the satisfaction of the patient and family.     Lucretia Roers 01/03/2021, 12:26 PM

## 2021-01-03 NOTE — ED Triage Notes (Signed)
Abd pain radiating to back on R side with n/v

## 2021-01-03 NOTE — Progress Notes (Signed)
[facility]  Updated husband surgery done. If feels ok can dc home. [facility] on [address] is where Rx sent. 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 Note provided. If needs additional time can notify the office.  Laurie Greenhouse, MD [facility] [address] Laurie Brooks [place], [place] [address] [phone] (office)

## 2021-01-03 NOTE — Anesthesia Postprocedure Evaluation (Signed)
Anesthesia Post Note  Patient: Laurie Brooks  Procedure(s) Performed: LAPAROSCOPIC CHOLECYSTECTOMY (N/A )  Patient location during evaluation: PACU Anesthesia Type: General Level of consciousness: awake and alert, oriented and sedated Pain management: pain level controlled Vital Signs Assessment: post-procedure vital signs reviewed and stable Respiratory status: spontaneous breathing and respiratory function stable Cardiovascular status: blood pressure returned to baseline and stable Postop Assessment: no apparent nausea or vomiting Anesthetic complications: no Comments: Patient was informed that she has enlarged and inflamed tonsils, patient aware she has enlarged tonsils    No complications documented.   Last Vitals:  Vitals:   01/03/21 1436 01/03/21 1441  BP: 124/73   Pulse: 94 90  Resp: (!) 9 12  Temp: 36.4 C   SpO2: 100% 100%    Last Pain:  Vitals:   01/03/21 1054  TempSrc:   PainSc: Asleep                 Joyce Leckey C Ellsie Violette

## 2021-01-05 ENCOUNTER — Encounter (HOSPITAL_COMMUNITY): Payer: Self-pay | Admitting: General Surgery

## 2021-01-06 LAB — SURGICAL PATHOLOGY

## 2021-01-19 ENCOUNTER — Ambulatory Visit (INDEPENDENT_AMBULATORY_CARE_PROVIDER_SITE_OTHER): Payer: 59 | Admitting: General Surgery

## 2021-01-19 DIAGNOSIS — K8000 Calculus of gallbladder with acute cholecystitis without obstruction: Secondary | ICD-10-CM

## 2021-01-19 NOTE — Progress Notes (Signed)
[facility]  I am calling the patient for post operative evaluation. This is not a billable encounter as it is under the global charges for the surgery.  The patient had a laparoscopic cholecystectomy on 01/03/2021. The patient did not answer and a message was left. She should call with question or concerns. Activity and diet as tolerated. Pathology report told to patient on message.   Pathology: FINAL MICROSCOPIC DIAGNOSIS:   A. GALLBLADDER, CHOLECYSTECTOMY:  - Acute on chronic cholecystitis with cholelithiasis.   Will see the patient PRN.   Algis Greenhouse, MD [facility] [address] [place], [place] [address] [phone] (office)

## 2021-11-02 ENCOUNTER — Inpatient Hospital Stay (HOSPITAL_COMMUNITY)
Admission: EM | Admit: 2021-11-02 | Discharge: 2021-11-03 | DRG: 392 | Disposition: A | Discharge status: Home / Self Care | Payer: 59 | Attending: Internal Medicine | Admitting: Internal Medicine

## 2021-11-02 ENCOUNTER — Other Ambulatory Visit: Payer: Self-pay

## 2021-11-02 ENCOUNTER — Encounter (HOSPITAL_COMMUNITY): Payer: Self-pay | Admitting: *Deleted

## 2021-11-02 ENCOUNTER — Emergency Department (HOSPITAL_COMMUNITY): Payer: 59

## 2021-11-02 DIAGNOSIS — E669 Obesity, unspecified: Secondary | ICD-10-CM | POA: Diagnosis present

## 2021-11-02 DIAGNOSIS — K5792 Diverticulitis of intestine, part unspecified, without perforation or abscess without bleeding: Secondary | ICD-10-CM | POA: Diagnosis not present

## 2021-11-02 DIAGNOSIS — K219 Gastro-esophageal reflux disease without esophagitis: Secondary | ICD-10-CM

## 2021-11-02 DIAGNOSIS — R1032 Left lower quadrant pain: Secondary | ICD-10-CM | POA: Diagnosis present

## 2021-11-02 DIAGNOSIS — R935 Abnormal findings on diagnostic imaging of other abdominal regions, including retroperitoneum: Secondary | ICD-10-CM

## 2021-11-02 DIAGNOSIS — E66811 Obesity, class 1: Secondary | ICD-10-CM

## 2021-11-02 DIAGNOSIS — K5732 Diverticulitis of large intestine without perforation or abscess without bleeding: Secondary | ICD-10-CM | POA: Diagnosis present

## 2021-11-02 DIAGNOSIS — Z91040 Latex allergy status: Secondary | ICD-10-CM

## 2021-11-02 DIAGNOSIS — Z8249 Family history of ischemic heart disease and other diseases of the circulatory system: Secondary | ICD-10-CM

## 2021-11-02 DIAGNOSIS — Z6834 Body mass index (BMI) 34.0-34.9, adult: Secondary | ICD-10-CM | POA: Diagnosis not present

## 2021-11-02 LAB — URINALYSIS, ROUTINE W REFLEX MICROSCOPIC
Bilirubin Urine: NEGATIVE
Glucose, UA: NEGATIVE mg/dL
Ketones, ur: 5 mg/dL — AB
Leukocytes,Ua: NEGATIVE
Nitrite: NEGATIVE
Protein, ur: 30 mg/dL — AB
Specific Gravity, Urine: 1.029 (ref 1.005–1.030)
pH: 5 (ref 5.0–8.0)

## 2021-11-02 LAB — COMPREHENSIVE METABOLIC PANEL
ALT: 14 U/L (ref 0–44)
AST: 15 U/L (ref 15–41)
Albumin: 4.1 g/dL (ref 3.5–5.0)
Alkaline Phosphatase: 154 U/L — ABNORMAL HIGH (ref 38–126)
Anion gap: 9 (ref 5–15)
BUN: 13 mg/dL (ref 6–20)
CO2: 27 mmol/L (ref 22–32)
Calcium: 9.3 mg/dL (ref 8.9–10.3)
Chloride: 102 mmol/L (ref 98–111)
Creatinine, Ser: 0.72 mg/dL (ref 0.44–1.00)
GFR, Estimated: >60 mL/min (ref >60)
Glucose, Bld: 90 mg/dL (ref 70–99)
Potassium: 3.3 mmol/L — ABNORMAL LOW (ref 3.5–5.1)
Sodium: 138 mmol/L (ref 135–145)
Total Bilirubin: 0.7 mg/dL (ref 0.3–1.2)
Total Protein: 8.5 g/dL — ABNORMAL HIGH (ref 6.5–8.1)

## 2021-11-02 LAB — CBC WITH DIFFERENTIAL/PLATELET
Abs Immature Granulocytes: 0.08 10*3/uL — ABNORMAL HIGH (ref 0.00–0.07)
Basophils Absolute: 0.1 10*3/uL (ref 0.0–0.1)
Basophils Relative: 1 %
Eosinophils Absolute: 0.2 10*3/uL (ref 0.0–0.5)
Eosinophils Relative: 1 %
HCT: 41.6 % (ref 36.0–46.0)
Hemoglobin: 13.7 g/dL (ref 12.0–15.0)
Immature Granulocytes: 1 %
Lymphocytes Relative: 26 %
Lymphs Abs: 4 10*3/uL (ref 0.7–4.0)
MCH: 30.4 pg (ref 26.0–34.0)
MCHC: 32.9 g/dL (ref 30.0–36.0)
MCV: 92.2 fL (ref 80.0–100.0)
Monocytes Absolute: 1.1 10*3/uL — ABNORMAL HIGH (ref 0.1–1.0)
Monocytes Relative: 7 %
Neutro Abs: 10.1 10*3/uL — ABNORMAL HIGH (ref 1.7–7.7)
Neutrophils Relative %: 64 %
Platelets: 321 10*3/uL (ref 150–400)
RBC: 4.51 MIL/uL (ref 3.87–5.11)
RDW: 13.1 % (ref 11.5–15.5)
WBC: 15.6 10*3/uL — ABNORMAL HIGH (ref 4.0–10.5)
nRBC: 0 % (ref 0.0–0.2)

## 2021-11-02 LAB — LIPASE, BLOOD: Lipase: 28 U/L (ref 11–51)

## 2021-11-02 LAB — HCG, SERUM, QUALITATIVE: Preg, Serum: NEGATIVE

## 2021-11-02 MED ORDER — ENOXAPARIN SODIUM 40 MG/0.4ML IJ SOSY: 40.0000 mg | PREFILLED_SYRINGE | INTRAMUSCULAR | Status: DC

## 2021-11-02 MED ORDER — HYDROCODONE-ACETAMINOPHEN 5-325 MG PO TABS
1.0000 | ORAL_TABLET | Freq: Once | ORAL | Status: AC
  Administered 2021-11-02: 1 via ORAL
  Filled 2021-11-02: qty 1

## 2021-11-02 MED ORDER — PIPERACILLIN-TAZOBACTAM 3.375 G IVPB
3.3750 g | Freq: Three times a day (TID) | INTRAVENOUS | Status: DC
  Administered 2021-11-03 (×2): 3.375 g via INTRAVENOUS
  Filled 2021-11-02 (×2): qty 50

## 2021-11-02 MED ORDER — VANCOMYCIN HCL 1250 MG/250ML IV SOLN: 1250.0000 mg | INTRAVENOUS | Status: DC

## 2021-11-02 MED ORDER — CIPROFLOXACIN IN D5W 400 MG/200ML IV SOLN: 400.0000 mg | Freq: Two times a day (BID) | INTRAVENOUS | Status: DC

## 2021-11-02 MED ORDER — MORPHINE SULFATE (PF) 2 MG/ML IV SOLN
2.0000 mg | INTRAVENOUS | Status: DC | PRN
  Filled 2021-11-02: qty 1

## 2021-11-02 MED ORDER — PIPERACILLIN-TAZOBACTAM 3.375 G IVPB 30 MIN: 3.3750 g | Freq: Once | INTRAVENOUS | Status: DC

## 2021-11-02 MED ORDER — LACTATED RINGERS IV BOLUS
1000.0000 mL | Freq: Once | INTRAVENOUS | Status: AC
  Administered 2021-11-02: 1000 mL via INTRAVENOUS

## 2021-11-02 MED ORDER — SODIUM CHLORIDE 0.9 % IV BOLUS: 1000.0000 mL | Freq: Once | INTRAVENOUS | Status: DC

## 2021-11-02 MED ORDER — PIPERACILLIN-TAZOBACTAM 3.375 G IVPB 30 MIN
3.3750 g | Freq: Once | INTRAVENOUS | Status: AC
  Administered 2021-11-02: 3.375 g via INTRAVENOUS
  Filled 2021-11-02: qty 50

## 2021-11-02 MED ORDER — PIPERACILLIN-TAZOBACTAM 3.375 G IVPB: 3.3750 g | Freq: Three times a day (TID) | INTRAVENOUS | Status: DC

## 2021-11-02 MED ORDER — IOHEXOL 300 MG/ML  SOLN
100.0000 mL | Freq: Once | INTRAMUSCULAR | Status: AC | PRN
  Administered 2021-11-02: 100 mL via INTRAVENOUS

## 2021-11-02 MED ORDER — VANCOMYCIN HCL 1750 MG/350ML IV SOLN
1750.0000 mg | Freq: Once | INTRAVENOUS | Status: AC
  Administered 2021-11-02: 1750 mg via INTRAVENOUS
  Filled 2021-11-02: qty 350

## 2021-11-02 MED ORDER — OXYCODONE HCL 5 MG PO TABS
5.0000 mg | ORAL_TABLET | ORAL | Status: DC | PRN
  Administered 2021-11-02: 5 mg via ORAL
  Filled 2021-11-02: qty 1

## 2021-11-02 MED ORDER — VANCOMYCIN HCL 1750 MG/350ML IV SOLN: 1750.0000 mg | Freq: Once | INTRAVENOUS | Status: DC

## 2021-11-02 MED ORDER — SODIUM CHLORIDE 0.9 % IV SOLN
8.0000 mg | Freq: Four times a day (QID) | INTRAVENOUS | Status: DC | PRN
  Filled 2021-11-02: qty 4

## 2021-11-02 MED ORDER — METRONIDAZOLE 500 MG/100ML IV SOLN: 500.0000 mg | Freq: Three times a day (TID) | INTRAVENOUS | Status: DC

## 2021-11-02 NOTE — Assessment & Plan Note (Addendum)
-  No nausea or vomiting; minimal abdominal discomfort and tolerating diet. ?-No perforation or abscess seen on CT scan images. ?-Case discussed with general surgery who recommended oral antibiotics and outpatient follow-up. ?-Patient will require colonoscopy evaluation in approximately 4 weeks. ?-Advised to increase fiber intake and to maintain adequate hydration. ?-Ibuprofen for analgesia has been prescribed at discharge. ? ?

## 2021-11-02 NOTE — H&P (Signed)
? ? ?HISTORY AND PHYSICAL ? ?Patient: Laurie Brooks 56 y.o. female ?MRN: [id number] ? ?Today is hospital day 0 after presenting to ED on 11/02/2021  4:19 PM with  ?Chief Complaint  ?Patient presents with  ? Abdominal Pain  ? ? ? ?RECORD REVIEW AND HOSPITAL COURSE: ?Patient is a 56 year old female who is status postcholecystectomy as well as hysterectomy who presents to the emergency department due to abdominal pain.  Patient states her symptoms started about 3 days ago.  They have been constant.  States the pain has worsened in the left lower quadrant but also notes suprapubic pain as well as epigastric pain.  Denies any nausea, vomiting, diarrhea, URI symptoms, dysuria, urinary frequency, vaginal discharge.  She was concerned that she could be constipated and states that she took a laxative and had a well formed bowel movement this morning which provided no relief of her symptoms, so she came to the emergency department for further evaluation. ?ED course: CT showed sigmoid diverticulitis w/ concern for possible pelvic involvement, WBC elevated but VS WNL, general surgery recommended admission for IV abx w/ vanc/zosyn and will see in AM ? ?Procedures and Significant Results:  ?CT Abdomen/Pelvic 11/02/21  ? ?Consultants:  ?See ED provider note for discussion w/ Dr Tora Perches OBGYN and Dr Corliss Blacker Surgery  ?General Surgery - ED spoke to Dr Lovell Sheehan 11/02/21  ? ? ? ?SUBJECTIVE:  ?Pt seen and examined resting in bed in ED, appears uncomfortable but no distress. Confirms above history as obtained by ED. No other concerns other than arms is a bit sore where IV placed  ? ? ? ? ?ASSESSMENT & PLAN ? ?Diverticulitis ?See CT abdomen 11/02/21 (+)sigmoid diverticuiltis without perforation/abscess, but with concern for possible ovarian involvement, unlikely but possible fistulization. ED provider spoke w/ OBGYN and w/ Gen Surg Dr. Lovell Sheehan.  ?No plans for surgery at this point but Gen Surg to follow tomorrow, consult placed   ?Admit for IV antibiotics w/ Vanc/Zosyn per GenSurg recs ?Continue pain control ?Continue IV fluids hydration, can advance to clear liquid diet as tolerated ?Radiology recommended eventual pelvic ultrasound after treatment, can likely be followed outpatient / per OBGYN  ? ? ?VTE Ppx: lovenox ?CODE STATUS: FULL ?Admitted from: home ?Expected Dispo: home ?Barriers to discharge: illness requiring IV abx  ?Family communication: husband at bedside  ? ? ? ? ? ? ? ? ? ? ? ? ? ?Past Medical History:  ?Diagnosis Date  ? Asthma   ? Chronic constipation   ? ? ?Past Surgical History:  ?Procedure Laterality Date  ? ABDOMINAL HYSTERECTOMY    ? CHOLECYSTECTOMY N/A 01/03/2021  ? Procedure: LAPAROSCOPIC CHOLECYSTECTOMY;  Surgeon: Lucretia Roers, MD;  Location: [facility];  Service: General;  Laterality: N/A;  ? ? ?Family History  ?Problem Relation Age of Onset  ? Heart disease Mother   ? ?Social History:  reports that she has never smoked. She has never used smokeless tobacco. She reports current alcohol use. She reports that she does not use drugs. ? ?Allergies: No Known Allergies ? ?(Not in a hospital admission) ? ? ?Results for orders placed or performed during the hospital encounter of 11/02/21 (from the past 48 hour(s))  ?Comprehensive metabolic panel     Status: Abnormal  ? Collection Time: 11/02/21  4:58 PM  ?Result Value Ref Range  ? Sodium 138 135 - 145 mmol/L  ? Potassium 3.3 (L) 3.5 - 5.1 mmol/L  ? Chloride 102 98 - 111 mmol/L  ? CO2 27 22 -  32 mmol/L  ? Glucose, Bld 90 70 - 99 mg/dL  ?  Comment: Glucose reference range applies only to samples taken after fasting for at least 8 hours.  ? BUN 13 6 - 20 mg/dL  ? Creatinine, Ser 0.72 0.44 - 1.00 mg/dL  ? Calcium 9.3 8.9 - 10.3 mg/dL  ? Total Protein 8.5 (H) 6.5 - 8.1 g/dL  ? Albumin 4.1 3.5 - 5.0 g/dL  ? AST 15 15 - 41 U/L  ? ALT 14 0 - 44 U/L  ? Alkaline Phosphatase 154 (H) 38 - 126 U/L  ? Total Bilirubin 0.7 0.3 - 1.2 mg/dL  ? GFR, Estimated >60 >60 mL/min  ?  Comment:  (NOTE) ?Calculated using the CKD-EPI Creatinine Equation (2021) ?  ? Anion gap 9 5 - 15  ?  Comment: Performed at [facility], [address] [place], [place] [address]  ?CBC with Differential     Status: Abnormal  ? Collection Time: 11/02/21  4:58 PM  ?Result Value Ref Range  ? WBC 15.6 (H) 4.0 - 10.5 K/uL  ? RBC 4.51 3.87 - 5.11 MIL/uL  ? Hemoglobin 13.7 12.0 - 15.0 g/dL  ? HCT 41.6 36.0 - 46.0 %  ? MCV 92.2 80.0 - 100.0 fL  ? MCH 30.4 26.0 - 34.0 pg  ? MCHC 32.9 30.0 - 36.0 g/dL  ? RDW 13.1 11.5 - 15.5 %  ? Platelets 321 150 - 400 K/uL  ? nRBC 0.0 0.0 - 0.2 %  ? Neutrophils Relative % 64 %  ? Neutro Abs 10.1 (H) 1.7 - 7.7 K/uL  ? Lymphocytes Relative 26 %  ? Lymphs Abs 4.0 0.7 - 4.0 K/uL  ? Monocytes Relative 7 %  ? Monocytes Absolute 1.1 (H) 0.1 - 1.0 K/uL  ? Eosinophils Relative 1 %  ? Eosinophils Absolute 0.2 0.0 - 0.5 K/uL  ? Basophils Relative 1 %  ? Basophils Absolute 0.1 0.0 - 0.1 K/uL  ? Immature Granulocytes 1 %  ? Abs Immature Granulocytes 0.08 (H) 0.00 - 0.07 K/uL  ?  Comment: Performed at [facility], [address] [place], [place] [address]  ?Lipase, blood     Status: None  ? Collection Time: 11/02/21  4:58 PM  ?Result Value Ref Range  ? Lipase 28 11 - 51 U/L  ?  Comment: Performed at [facility], [address] [place], [place] [address]  ?hCG, serum, qualitative     Status: None  ? Collection Time: 11/02/21  4:58 PM  ?Result Value Ref Range  ? Preg, Serum NEGATIVE NEGATIVE  ?  Comment:        ?THE SENSITIVITY OF THIS ?METHODOLOGY IS >10 mIU/mL. ?Performed at [facility], [address] [place], [place] [address] ?  ?Urinalysis, Routine w reflex microscopic Urine, Clean Catch     Status: Abnormal  ? Collection Time: 11/02/21  5:01 PM  ?Result Value Ref Range  ? Color, Urine AMBER (A) YELLOW  ?  Comment: BIOCHEMICALS MAY BE AFFECTED BY COLOR  ? APPearance HAZY (A) CLEAR  ? Specific Gravity, Urine 1.029 1.005 - 1.030  ? pH 5.0 5.0 - 8.0  ? Glucose, UA NEGATIVE NEGATIVE mg/dL  ? Hgb urine dipstick  MODERATE (A) NEGATIVE  ? Bilirubin Urine NEGATIVE NEGATIVE  ? Ketones, ur 5 (A) NEGATIVE mg/dL  ? Protein, ur 30 (A) NEGATIVE mg/dL  ? Nitrite NEGATIVE NEGATIVE  ? Leukocytes,Ua NEGATIVE NEGATIVE  ? RBC / HPF 0-5 0 - 5 RBC/hpf  ? WBC, UA  6-10 0 - 5 WBC/hpf  ? Bacteria, UA RARE (A) NONE SEEN  ? Squamous Epithelial / LPF 0-5 0 - 5  ? Mucus PRESENT   ?  Comment: Performed at [facility], [address] [place], [place] [address]  ? ?CT ABDOMEN PELVIS W CONTRAST ? ?Result Date: 11/02/2021 ?CLINICAL DATA:  Four days of abdominal pain. EXAM: CT ABDOMEN AND PELVIS WITH CONTRAST TECHNIQUE: Multidetector CT imaging of the abdomen and pelvis was performed using the standard protocol following bolus administration of intravenous contrast. RADIATION DOSE REDUCTION: This exam was performed according to the departmental dose-optimization program which includes automated exposure control, adjustment of the mA and/or kV according to patient size and/or use of iterative reconstruction technique. CONTRAST:  OMNIPAQUE IOHEXOL 300 MG/ML  SOLN COMPARISON:  Jan 03, 2021 CT FINDINGS: Lower chest: No acute abnormality. Hepatobiliary: Hypodense 2.3 cm lesion in the inferior right lobe of the liver on image 36/9 demonstrates peripheral nodular discontinuous enhancement with some filling in of lesion on delayed imaging, stable in size dating back to 2022, most consistent with a benign hepatic hemangioma. Gallbladder is surgically absent. No biliary ductal dilation. Pancreas: No pancreatic ductal dilation or evidence of acute inflammation. Spleen: No splenomegaly or focal splenic lesion. Adrenals/Urinary Tract: Bilateral adrenal glands appear normal. No hydronephrosis. Kidneys demonstrate symmetric enhancement and excretion of contrast material. Urinary bladder is unremarkable for degree of distension. Stomach/Bowel: No enteric contrast was administered. Stomach is unremarkable for degree of distension. No pathologic dilation of small or  large bowel. The appendix and terminal ileum appear normal. Sigmoid colonic diverticulosis with inflamed diverticulum, colonic wall thickening and adjacent inflammation involving a short loop of sigmoid c

## 2021-11-02 NOTE — ED Provider Notes (Signed)
?[facility] ?Provider Note ? ? ?CSN: [id number] ?Arrival date & time: 11/02/21  1600 ? ?  ? ?History ? ?Chief Complaint  ?Patient presents with  ? Abdominal Pain  ? ? ?Laurie Brooks is a 56 y.o. female. ? ?HPI ?Patient is a 56 year old female who is status postcholecystectomy as well as hysterectomy who presents to the emergency department due to abdominal pain.  Patient states her symptoms started about 3 days ago.  They have been constant.  States the pain has worsened in the left lower quadrant but also notes suprapubic pain as well as epigastric pain.  Denies any nausea, vomiting, diarrhea, URI symptoms, dysuria, urinary frequency, vaginal discharge.  She was concerned that she could be constipated and states that she took a laxative and had a well formed bowel movement this morning which provided no relief of her symptoms, so she came to the emergency department for further evaluation. ?  ? ?Home Medications ?Prior to Admission medications   ?Medication Sig Start Date End Date Taking? Authorizing Provider  ?ondansetron (ZOFRAN) 4 MG tablet Take 1 tablet (4 mg total) by mouth every 8 (eight) hours as needed. 01/03/21 01/03/22  Virl Cagey, MD  ?oxyCODONE (ROXICODONE) 5 MG immediate release tablet Take 1 tablet (5 mg total) by mouth every 4 (four) hours as needed for severe pain or breakthrough pain. 01/03/21 01/03/22  Virl Cagey, MD  ?   ? ?Allergies    ?Patient has no known allergies.   ? ?Review of Systems   ?Review of Systems  ?All other systems reviewed and are negative. ?Ten systems reviewed and are negative for acute change, except as noted in the HPI.   ?Physical Exam ?Updated Vital Signs ?BP 126/69 (BP Location: Right Arm)   Pulse 74   Temp 98 ?F (36.7 ?C) (Oral)   Resp 20   Ht 5\' 3"  (1.6 m)   Wt 87.1 kg   SpO2 97%   BMI 34.01 kg/m?  ?Physical Exam ?Vitals and nursing note reviewed.  ?Constitutional:   ?   General: She is not in acute distress. ?   Appearance:  Normal appearance. She is not ill-appearing, toxic-appearing or diaphoretic.  ?HENT:  ?   Head: Normocephalic and atraumatic.  ?   Right Ear: External ear normal.  ?   Left Ear: External ear normal.  ?   Nose: Nose normal.  ?   Mouth/Throat:  ?   Mouth: Mucous membranes are moist.  ?   Pharynx: Oropharynx is clear. No oropharyngeal exudate or posterior oropharyngeal erythema.  ?Eyes:  ?   Extraocular Movements: Extraocular movements intact.  ?Cardiovascular:  ?   Rate and Rhythm: Normal rate and regular rhythm.  ?   Pulses: Normal pulses.  ?   Heart sounds: Normal heart sounds. No murmur heard. ?  No friction rub. No gallop.  ?Pulmonary:  ?   Effort: Pulmonary effort is normal. No respiratory distress.  ?   Breath sounds: Normal breath sounds. No stridor. No wheezing, rhonchi or rales.  ?Abdominal:  ?   General: Abdomen is flat.  ?   Palpations: Abdomen is soft.  ?   Tenderness: There is abdominal tenderness in the epigastric area, suprapubic area and left lower quadrant.  ?   Comments: Abdomen is flat and soft.  Mild to moderate tenderness noted overlying the epigastrium, suprapubic region, as well as the left lower quadrant that appears to be worst in the left lower quadrant.  ?Musculoskeletal:     ?  General: Normal range of motion.  ?   Cervical back: Normal range of motion and neck supple. No tenderness.  ?Skin: ?   General: Skin is warm and dry.  ?Neurological:  ?   General: No focal deficit present.  ?   Mental Status: She is alert and oriented to person, place, and time.  ?Psychiatric:     ?   Mood and Affect: Mood normal.     ?   Behavior: Behavior normal.  ? ?ED Results / Procedures / Treatments   ?Labs ?(all labs ordered are listed, but only abnormal results are displayed) ?Labs Reviewed  ?COMPREHENSIVE METABOLIC PANEL - Abnormal; Notable for the following components:  ?    Result Value  ? Potassium 3.3 (*)   ? Total Protein 8.5 (*)   ? Alkaline Phosphatase 154 (*)   ? All other components within normal  limits  ?CBC WITH DIFFERENTIAL/PLATELET - Abnormal; Notable for the following components:  ? WBC 15.6 (*)   ? Neutro Abs 10.1 (*)   ? Monocytes Absolute 1.1 (*)   ? Abs Immature Granulocytes 0.08 (*)   ? All other components within normal limits  ?URINALYSIS, ROUTINE W REFLEX MICROSCOPIC - Abnormal; Notable for the following components:  ? Color, Urine AMBER (*)   ? APPearance HAZY (*)   ? Hgb urine dipstick MODERATE (*)   ? Ketones, ur 5 (*)   ? Protein, ur 30 (*)   ? Bacteria, UA RARE (*)   ? All other components within normal limits  ?LIPASE, BLOOD  ?HCG, SERUM, QUALITATIVE  ? ? ?EKG ?None ? ?Radiology ?CT ABDOMEN PELVIS W CONTRAST ? ?Result Date: 11/02/2021 ?CLINICAL DATA:  Four days of abdominal pain. EXAM: CT ABDOMEN AND PELVIS WITH CONTRAST TECHNIQUE: Multidetector CT imaging of the abdomen and pelvis was performed using the standard protocol following bolus administration of intravenous contrast. RADIATION DOSE REDUCTION: This exam was performed according to the departmental dose-optimization program which includes automated exposure control, adjustment of the mA and/or kV according to patient size and/or use of iterative reconstruction technique. CONTRAST:  167mL OMNIPAQUE IOHEXOL 300 MG/ML  SOLN COMPARISON:  Jan 03, 2021 CT FINDINGS: Lower chest: No acute abnormality. Hepatobiliary: Hypodense 2.3 cm lesion in the inferior right lobe of the liver on image 36/9 demonstrates peripheral nodular discontinuous enhancement with some filling in of lesion on delayed imaging, stable in size dating back to 2022, most consistent with a benign hepatic hemangioma. Gallbladder is surgically absent. No biliary ductal dilation. Pancreas: No pancreatic ductal dilation or evidence of acute inflammation. Spleen: No splenomegaly or focal splenic lesion. Adrenals/Urinary Tract: Bilateral adrenal glands appear normal. No hydronephrosis. Kidneys demonstrate symmetric enhancement and excretion of contrast material. Urinary bladder  is unremarkable for degree of distension. Stomach/Bowel: No enteric contrast was administered. Stomach is unremarkable for degree of distension. No pathologic dilation of small or large bowel. The appendix and terminal ileum appear normal. Sigmoid colonic diverticulosis with inflamed diverticulum, colonic wall thickening and adjacent inflammation involving a short loop of sigmoid colon. Vascular/Lymphatic: Normal caliber abdominal aorta. No pathologically enlarged abdominal or pelvic lymph nodes. Reproductive: Uterus is surgically absent. Right adnexa is unremarkable. Pelvic inflammation extends from the sigmoid diverticulitis to the left adnexa with prominence of the left ovary. Other: No walled off fluid collections.  No pneumoperitoneum. Musculoskeletal: No acute or significant osseous findings. IMPRESSION: Acute sigmoid diverticulitis without evidence of perforation or walled off fluid collection. Pelvic inflammation extends from the sigmoid diverticulitis to the left adnexa with  prominence of the left ovary, a colo-ovarian fistula while rare is a possibility, would suggest further evaluation with pelvic ultrasound after completion of therapy. Electronically Signed   By: Dahlia Bailiff M.D.   On: 11/02/2021 19:01   ? ?Procedures ?Procedures  ? ?Medications Ordered in ED ?Medications  ?HYDROcodone-acetaminophen (NORCO/VICODIN) 5-325 MG per tablet 1 tablet (has no administration in time range)  ?lactated ringers bolus 1,000 mL (has no administration in time range)  ?iohexol (OMNIPAQUE) 300 MG/ML solution 100 mL (100 mLs Intravenous Contrast Given 11/02/21 1846)  ? ? ?ED Course/ Medical Decision Making/ A&P ?Clinical Course as of 11/02/21 2112  ?Tue Nov 02, 2021  ?2014 Patient discussed with Dr. Elonda Husky with OB/GYN regarding the CT scan findings.  Believes that this was likely a ruptured diverticular abscess that now shows infection surrounding the left ovary.  Does not feel that ultrasound is warranted at this time.   Recommends possibility of general surgery consult for further recommendations. [LJ]  ?2045 Patient discussed with Dr. Arnoldo Morale with general surgery.  Recommends medicine admission.  Recommends Vanco/Zosyn.  The

## 2021-11-02 NOTE — ED Triage Notes (Signed)
Pt with LLQ that goes across lower abd since Saturday, denies any N/V/D.  Denies any burning with urination.  ?

## 2021-11-02 NOTE — Progress Notes (Signed)
Pharmacy Antibiotic Note ? ?Laurie Brooks is a 56 y.o. female admitted on 11/02/2021 with  intra-abdominal infection/ acute sigmoid diverticultis .  Pharmacy has been consulted for Vancomycin and Zosyn dosing. ? ?Plan: ?Zosyn 3.375 gm IV over 30 minutes x 1, then q8h (each over 4 hours). ?Vancomycin 1750 mg IV x 1, then ?Vancomycin 1250 mg IV Q 24 hrs.  ?Goal AUC 400-550. ?Expected AUC: 476 ?SCr used: 0.8, Vd 0.5L/kg for BMI >30 ?Follow renal function, clinical progress, antibiotic and treatment plans. ? ?Height: 5\' 3"  (160 cm) ?Weight: 87.1 kg (192 lb) ?IBW/kg (Calculated) : 52.4 ? ?Temp (24hrs), Avg:98 ?F (36.7 ?C), Min:98 ?F (36.7 ?C), Max:98 ?F (36.7 ?C) ? ?Recent Labs  ?Lab 11/02/21 ?1658  ?WBC 15.6*  ?CREATININE 0.72  ?  ?Estimated Creatinine Clearance: 85.1 mL/min (by C-G formula based on SCr of 0.72 mg/dL).   ? ?No Known Allergies ? ?Antimicrobials this admission: ? Vancomycin 3/28 >> ? Zosyn 3/28 >> ? ?Dose adjustments this admission: ?n/a ? ?Microbiology results: ? - none ? ?Thank you for allowing pharmacy to be a part of this patient?s care. ? ?Arty Baumgartner, RPh ?11/02/2021 9:31 PM ? ?

## 2021-11-03 DIAGNOSIS — R935 Abnormal findings on diagnostic imaging of other abdominal regions, including retroperitoneum: Secondary | ICD-10-CM

## 2021-11-03 DIAGNOSIS — E66811 Obesity, class 1: Secondary | ICD-10-CM

## 2021-11-03 DIAGNOSIS — E669 Obesity, unspecified: Secondary | ICD-10-CM

## 2021-11-03 DIAGNOSIS — K219 Gastro-esophageal reflux disease without esophagitis: Secondary | ICD-10-CM

## 2021-11-03 LAB — CBC
HCT: 34.5 % — ABNORMAL LOW (ref 36.0–46.0)
Hemoglobin: 11.8 g/dL — ABNORMAL LOW (ref 12.0–15.0)
MCH: 31 pg (ref 26.0–34.0)
MCHC: 34.2 g/dL (ref 30.0–36.0)
MCV: 90.6 fL (ref 80.0–100.0)
Platelets: 268 10*3/uL (ref 150–400)
RBC: 3.81 MIL/uL — ABNORMAL LOW (ref 3.87–5.11)
RDW: 12.9 % (ref 11.5–15.5)
WBC: 10.7 10*3/uL — ABNORMAL HIGH (ref 4.0–10.5)
nRBC: 0 % (ref 0.0–0.2)

## 2021-11-03 LAB — BASIC METABOLIC PANEL
Anion gap: 2 — ABNORMAL LOW (ref 5–15)
BUN: 10 mg/dL (ref 6–20)
CO2: 24 mmol/L (ref 22–32)
Calcium: 8.3 mg/dL — ABNORMAL LOW (ref 8.9–10.3)
Chloride: 111 mmol/L (ref 98–111)
Creatinine, Ser: 0.6 mg/dL (ref 0.44–1.00)
GFR, Estimated: >60 mL/min (ref >60)
Glucose, Bld: 91 mg/dL (ref 70–99)
Potassium: 3.5 mmol/L (ref 3.5–5.1)
Sodium: 137 mmol/L (ref 135–145)

## 2021-11-03 LAB — HIV ANTIBODY (ROUTINE TESTING W REFLEX): HIV Screen 4th Generation wRfx: NONREACTIVE

## 2021-11-03 MED ORDER — DIPHENHYDRAMINE HCL 25 MG PO CAPS
25.0000 mg | ORAL_CAPSULE | Freq: Once | ORAL | Status: AC
  Administered 2021-11-03: 25 mg via ORAL
  Filled 2021-11-03: qty 1

## 2021-11-03 MED ORDER — AMOXICILLIN-POT CLAVULANATE 875-125 MG PO TABS: 1.0000 | ORAL_TABLET | Freq: Two times a day (BID) | ORAL | 0 refills | Status: AC

## 2021-11-03 MED ORDER — POLYETHYLENE GLYCOL 3350 17 G PO PACK: 17.0000 g | PACK | Freq: Every day | ORAL | 0 refills | Status: AC

## 2021-11-03 MED ORDER — MORPHINE SULFATE (PF) 2 MG/ML IV SOLN: 2.0000 mg | INTRAVENOUS | Status: DC | PRN

## 2021-11-03 MED ORDER — OXYCODONE HCL 5 MG PO TABS
5.0000 mg | ORAL_TABLET | Freq: Four times a day (QID) | ORAL | Status: DC | PRN
  Administered 2021-11-03: 5 mg via ORAL
  Filled 2021-11-03: qty 1

## 2021-11-03 MED ORDER — ACETAMINOPHEN 325 MG PO TABS
650.0000 mg | ORAL_TABLET | Freq: Four times a day (QID) | ORAL | Status: DC | PRN
  Administered 2021-11-03: 650 mg via ORAL
  Filled 2021-11-03 (×2): qty 2

## 2021-11-03 MED ORDER — OMEPRAZOLE MAGNESIUM 20 MG PO TBEC: 40.0000 mg | DELAYED_RELEASE_TABLET | Freq: Two times a day (BID) | ORAL | 1 refills | Status: AC

## 2021-11-03 MED ORDER — ONDANSETRON 8 MG PO TBDP: 8.0000 mg | ORAL_TABLET | Freq: Three times a day (TID) | ORAL | 0 refills | Status: AC | PRN

## 2021-11-03 MED ORDER — IBUPROFEN 800 MG PO TABS: 800.0000 mg | ORAL_TABLET | Freq: Three times a day (TID) | ORAL | 0 refills | Status: AC | PRN

## 2021-11-03 NOTE — Assessment & Plan Note (Signed)
-  Continue the use of PPI. ?-While acutely treating patient with ibuprofen for diverticulitis process instructed to take medication twice a day for better protection and symptomatic relief. ?

## 2021-11-03 NOTE — Consult Note (Signed)
Reason for Consult: Sigmoid diverticulitis ?Referring Physician: Dr. Gwenlyn PerkingMadera ? ?Laurie Brooks is an 56 y.o. female.  ?HPI: Patient is a 56 year old white female who presented to the emergency room with a 3-day history of worsening left lower quadrant abdominal pain.  She is also noted some suprapubic abdominal pain.  She denies any nausea, vomiting, diarrhea, or blood in her stools.  She has never had a colonoscopy.  She was found on CT scan of the abdomen to have sigmoid diverticulitis with inflammation affecting the left adnexa.  She denies any vaginal discharge.  She has never had these symptoms before.  She was admitted to the hospital and started on vancomycin and Zosyn.  This morning, she feels slightly better but is still tender in the left lower quadrant.  Her last bowel movement was yesterday.  She has not had a bowel movement since admission.  She denies any fever or chills. ? ?Past Medical History:  ?Diagnosis Date  ? Asthma   ? Chronic constipation   ? ? ?Past Surgical History:  ?Procedure Laterality Date  ? ABDOMINAL HYSTERECTOMY    ? CHOLECYSTECTOMY N/A 01/03/2021  ? Procedure: LAPAROSCOPIC CHOLECYSTECTOMY;  Surgeon: Lucretia RoersBridges, Lindsay C, MD;  Location: [facility];  Service: General;  Laterality: N/A;  ? ? ?Family History  ?Problem Relation Age of Onset  ? Heart disease Mother   ? ? ?Social History:  reports that she has never smoked. She has never used smokeless tobacco. She reports current alcohol use. She reports that she does not use drugs. ? ?Allergies:  ?Allergies  ?Allergen Reactions  ? Latex Rash  ? ? ?Medications: I have reviewed the patient's current medications. ? ?Results for orders placed or performed during the hospital encounter of 11/02/21 (from the past 48 hour(s))  ?Comprehensive metabolic panel     Status: Abnormal  ? Collection Time: 11/02/21  4:58 PM  ?Result Value Ref Range  ? Sodium 138 135 - 145 mmol/L  ? Potassium 3.3 (L) 3.5 - 5.1 mmol/L  ? Chloride 102 98 - 111 mmol/L  ? CO2 27  22 - 32 mmol/L  ? Glucose, Bld 90 70 - 99 mg/dL  ?  Comment: Glucose reference range applies only to samples taken after fasting for at least 8 hours.  ? BUN 13 6 - 20 mg/dL  ? Creatinine, Ser 0.72 0.44 - 1.00 mg/dL  ? Calcium 9.3 8.9 - 10.3 mg/dL  ? Total Protein 8.5 (H) 6.5 - 8.1 g/dL  ? Albumin 4.1 3.5 - 5.0 g/dL  ? AST 15 15 - 41 U/L  ? ALT 14 0 - 44 U/L  ? Alkaline Phosphatase 154 (H) 38 - 126 U/L  ? Total Bilirubin 0.7 0.3 - 1.2 mg/dL  ? GFR, Estimated >60 >60 mL/min  ?  Comment: (NOTE) ?Calculated using the CKD-EPI Creatinine Equation (2021) ?  ? Anion gap 9 5 - 15  ?  Comment: Performed at [facility], [address] [place], [place] [address]  ?CBC with Differential     Status: Abnormal  ? Collection Time: 11/02/21  4:58 PM  ?Result Value Ref Range  ? WBC 15.6 (H) 4.0 - 10.5 K/uL  ? RBC 4.51 3.87 - 5.11 MIL/uL  ? Hemoglobin 13.7 12.0 - 15.0 g/dL  ? HCT 41.6 36.0 - 46.0 %  ? MCV 92.2 80.0 - 100.0 fL  ? MCH 30.4 26.0 - 34.0 pg  ? MCHC 32.9 30.0 - 36.0 g/dL  ? RDW 13.1 11.5 - 15.5 %  ?  Platelets 321 150 - 400 K/uL  ? nRBC 0.0 0.0 - 0.2 %  ? Neutrophils Relative % 64 %  ? Neutro Abs 10.1 (H) 1.7 - 7.7 K/uL  ? Lymphocytes Relative 26 %  ? Lymphs Abs 4.0 0.7 - 4.0 K/uL  ? Monocytes Relative 7 %  ? Monocytes Absolute 1.1 (H) 0.1 - 1.0 K/uL  ? Eosinophils Relative 1 %  ? Eosinophils Absolute 0.2 0.0 - 0.5 K/uL  ? Basophils Relative 1 %  ? Basophils Absolute 0.1 0.0 - 0.1 K/uL  ? Immature Granulocytes 1 %  ? Abs Immature Granulocytes 0.08 (H) 0.00 - 0.07 K/uL  ?  Comment: Performed at [facility], [address], [place], [place] [address]  ?Lipase, blood     Status: None  ? Collection Time: 11/02/21  4:58 PM  ?Result Value Ref Range  ? Lipase 28 11 - 51 U/L  ?  Comment: Performed at [facility], [address], [place], [place] [address]  ?hCG, serum, qualitative     Status: None  ? Collection Time: 11/02/21  4:58 PM  ?Result Value Ref Range  ? Preg, Serum NEGATIVE NEGATIVE  ?  Comment:        ?THE SENSITIVITY OF  THIS ?METHODOLOGY IS >10 mIU/mL. ?Performed at [facility], [address], [place], [place] [address] ?  ?Urinalysis, Routine w reflex microscopic Urine, Clean Catch     Status: Abnormal  ? Collection Time: 11/02/21  5:01 PM  ?Result Value Ref Range  ? Color, Urine AMBER (A) YELLOW  ?  Comment: BIOCHEMICALS MAY BE AFFECTED BY COLOR  ? APPearance HAZY (A) CLEAR  ? Specific Gravity, Urine 1.029 1.005 - 1.030  ? pH 5.0 5.0 - 8.0  ? Glucose, UA NEGATIVE NEGATIVE mg/dL  ? Hgb urine dipstick MODERATE (A) NEGATIVE  ? Bilirubin Urine NEGATIVE NEGATIVE  ? Ketones, ur 5 (A) NEGATIVE mg/dL  ? Protein, ur 30 (A) NEGATIVE mg/dL  ? Nitrite NEGATIVE NEGATIVE  ? Leukocytes,Ua NEGATIVE NEGATIVE  ? RBC / HPF 0-5 0 - 5 RBC/hpf  ? WBC, UA 6-10 0 - 5 WBC/hpf  ? Bacteria, UA RARE (A) NONE SEEN  ? Squamous Epithelial / LPF 0-5 0 - 5  ? Mucus PRESENT   ?  Comment: Performed at [facility], [address], [place], [place] [address]  ?HIV Antibody (routine testing w rflx)     Status: None  ? Collection Time: 11/03/21  5:26 AM  ?Result Value Ref Range  ? HIV Screen 4th Generation wRfx Non Reactive Non Reactive  ?  Comment: Performed at [facility], [address] [place], [place] [address]  ?CBC     Status: Abnormal  ? Collection Time: 11/03/21  5:26 AM  ?Result Value Ref Range  ? WBC 10.7 (H) 4.0 - 10.5 K/uL  ? RBC 3.81 (L) 3.87 - 5.11 MIL/uL  ? Hemoglobin 11.8 (L) 12.0 - 15.0 g/dL  ? HCT 34.5 (L) 36.0 - 46.0 %  ? MCV 90.6 80.0 - 100.0 fL  ? MCH 31.0 26.0 - 34.0 pg  ? MCHC 34.2 30.0 - 36.0 g/dL  ? RDW 12.9 11.5 - 15.5 %  ? Platelets 268 150 - 400 K/uL  ? nRBC 0.0 0.0 - 0.2 %  ?  Comment: Performed at [facility], [address], [place], [place] [address]  ?Basic metabolic panel     Status: Abnormal  ? Collection Time: 11/03/21  5:26 AM  ?Result Value Ref Range  ?  Sodium 137 135 - 145 mmol/L  ? Potassium 3.5 3.5 - 5.1 mmol/L  ? Chloride 111 98 - 111 mmol/L  ?  Comment: Electrolytes repeated to confirm.  ? CO2 24 22 - 32 mmol/L  ?  Glucose, Bld 91 70 - 99 mg/dL  ?  Comment: Glucose reference range applies only to samples taken after fasting for at least 8 hours.  ? BUN 10 6 - 20 mg/dL  ? Creatinine, Ser 0.60 0.44 - 1.00 mg/dL  ? Calcium 8.3 (L) 8.9 - 10.3 mg/dL  ? GFR, Estimated >60 >60 mL/min  ?  Comment: (NOTE) ?Calculated using the CKD-EPI Creatinine Equation (2021) ?  ? Anion gap 2 (L) 5 - 15  ?  Comment: Performed at [facility], [address] [place], [place] [address]  ? ? ?CT ABDOMEN PELVIS W CONTRAST ? ?Result Date: 11/02/2021 ?CLINICAL DATA:  Four days of abdominal pain. EXAM: CT ABDOMEN AND PELVIS WITH CONTRAST TECHNIQUE: Multidetector CT imaging of the abdomen and pelvis was performed using the standard protocol following bolus administration of intravenous contrast. RADIATION DOSE REDUCTION: This exam was performed according to the departmental dose-optimization program which includes automated exposure control, adjustment of the mA and/or kV according to patient size and/or use of iterative reconstruction technique. CONTRAST:  OMNIPAQUE IOHEXOL 300 MG/ML  SOLN COMPARISON:  Jan 03, 2021 CT FINDINGS: Lower chest: No acute abnormality. Hepatobiliary: Hypodense 2.3 cm lesion in the inferior right lobe of the liver on image 36/9 demonstrates peripheral nodular discontinuous enhancement with some filling in of lesion on delayed imaging, stable in size dating back to 2022, most consistent with a benign hepatic hemangioma. Gallbladder is surgically absent. No biliary ductal dilation. Pancreas: No pancreatic ductal dilation or evidence of acute inflammation. Spleen: No splenomegaly or focal splenic lesion. Adrenals/Urinary Tract: Bilateral adrenal glands appear normal. No hydronephrosis. Kidneys demonstrate symmetric enhancement and excretion of contrast material. Urinary bladder is unremarkable for degree of distension. Stomach/Bowel: No enteric contrast was administered. Stomach is unremarkable for degree of distension. No  pathologic dilation of small or large bowel. The appendix and terminal ileum appear normal. Sigmoid colonic diverticulosis with inflamed diverticulum, colonic wall thickening and adjacent inflammation involvi

## 2021-11-03 NOTE — Discharge Summary (Signed)
?Physician Discharge Summary ?  ?Patient: Laurie Brooks MRN: [id number] DOB: [birth year]  ?Admit date:     11/02/2021  ?Discharge date: 11/03/21  ?Discharge Physician: Vassie Loll  ? ?PCP: [facility]  ? ?Recommendations at discharge:  ?-Reassess resolution of patient's symptoms ?-Make sure patient follow-up with general surgery as instructed. ?-Assist patient with weight loss management. ? ?Discharge Diagnoses: ?Diverticulitis ?Gastroesophageal flux disease ?Class I obesity ?Abnormal findings in her left ovary ? ?Hospital Course: ?As per H&P written by Dr. Lyn Hollingshead on 11/02/2021 ?Patient is a 56 year old female who is status postcholecystectomy as well as hysterectomy who presents to the emergency department due to abdominal pain.  Patient states her symptoms started about 3 days ago.  They have been constant.  States the pain has worsened in the left lower quadrant but also notes suprapubic pain as well as epigastric pain.  Denies any nausea, vomiting, diarrhea, URI symptoms, dysuria, urinary frequency, vaginal discharge.  She was concerned that she could be constipated and states that she took a laxative and had a well formed bowel movement this morning which provided no relief of her symptoms, so she came to the emergency department for further evaluation. ?ED course: CT showed sigmoid diverticulitis w/ concern for possible pelvic involvement, WBC elevated but VS WNL, general surgery recommended admission for IV abx w/ vanc/zosyn and will see in AM ? ?Assessment and Plan: ?* Diverticulitis ?-No nausea or vomiting; minimal abdominal discomfort and tolerating diet. ?-No perforation or abscess seen on CT scan images. ?-Case discussed with general surgery who recommended oral antibiotics and outpatient follow-up. ?-Patient will require colonoscopy evaluation in approximately 4 weeks. ?-Advised to increase fiber intake and to maintain adequate hydration. ?-Ibuprofen for analgesia has been prescribed at  discharge. ? ? ?Abnormal CT scan, pelvis ?-Incidental abnormalities of her left ovary ?-Case discussed with OB/GYN provider on-call (Dr. Despina Hidden) ?-Recommendations given for eventual pelvic ultrasound after treatment, can likely be followed outpatient / per OBGYN. ?-When discussed with general surgeons they said they will follow and repeat images and address any further abnormalities appreciated. ? ?Obesity, Class I, BMI 30-34.9 ?-Low calorie diet, portion control and increase physical activity discussed with patient. ?-Body mass index is 34.01 kg/m?. ? ? ?GERD (gastroesophageal reflux disease) ?-Continue the use of PPI. ?-While acutely treating patient with ibuprofen for diverticulitis process instructed to take medication twice a day for better protection and symptomatic relief. ? ? ?Consultants: General surgery; OB/GYN was curbside by ED physician. ?Procedures performed: See below for x-ray reports. ?Disposition: Home ?Diet recommendation:  ?Regular diet ?DISCHARGE MEDICATION: ?Allergies as of 11/03/2021   ? ?   Reactions  ? Latex Rash  ? ?  ? ?  ?Medication List  ?  ? ?STOP taking these medications   ? ?ondansetron 4 MG tablet ?Commonly known as: Zofran ?  ?oxyCODONE 5 MG immediate release tablet ?Commonly known as: Roxicodone ?  ? ?  ? ?TAKE these medications   ? ?amoxicillin-clavulanate 875-125 MG tablet ?Commonly known as: Augmentin ?Take 1 tablet by mouth 2 (two) times daily for 10 days. ?  ?ibuprofen 800 MG tablet ?Commonly known as: ADVIL ?Take 1 tablet (800 mg total) by mouth every 8 (eight) hours as needed for up to 10 days for moderate pain or fever. ?  ?omeprazole 20 MG tablet ?Commonly known as: PriLOSEC OTC ?Take 2 tablets (40 mg total) by mouth in the morning and at bedtime. ?  ?ondansetron 8 MG disintegrating tablet ?Commonly known as: ZOFRAN-ODT ?Take 1 tablet (8  mg total) by mouth every 8 (eight) hours as needed for nausea or vomiting. ?  ?polyethylene glycol 17 g packet ?Commonly known as:  MiraLax ?Take 17 g by mouth daily. ?  ? ?  ? ? Follow-up Information   ? ? [facility]. Schedule an appointment as soon as possible for a visit in 2 week(s).   ?Specialty: Family Medicine ?Why: earlier as needed ?Contact information: ?[address] [place] [address] ?[phone] ? ? ?  ?  ? ? Franky MachoJenkins, Mark, MD. Schedule an appointment as soon as possible for a visit in 4 week(s).   ?Specialty: General Surgery ?Why: Contact office for details schedule an appointment. ?Contact information: ?[address] ?[place] [address] ?[phone] ? ? ?  ?  ? ?  ?  ? ?  ? ?Discharge Exam: ?Filed Weights  ? 11/02/21 1616  ?Weight: 87.1 kg  ? ?General exam: Alert, awake, oriented x 3; no chest pain, no fever, no nausea, no vomiting. ?Respiratory system: Clear to auscultation. Respiratory effort normal.  No using accessory muscle.  Good saturation on room air. ?Cardiovascular system:RRR. No murmurs, rubs, gallops.  No JVD. ?Gastrointestinal system: Abdomen is nondistended, soft and without guarding.  Left lower quadrant discomfort on the palpation.  Positive bowel sounds. ?Central nervous system: Alert and oriented. No focal neurological deficits. ?Extremities: No C/C/E, +pedal pulses ?Skin: No rashes, lesions or ulcers ?Psychiatry: Judgement and insight appear normal. Mood & affect appropriate.  ? ? ?Condition at discharge: Stable and improved. ? ?The results of significant diagnostics from this hospitalization (including imaging, microbiology, ancillary and laboratory) are listed below for reference.  ? ?Imaging Studies: ?CT ABDOMEN PELVIS W CONTRAST ? ?Result Date: 11/02/2021 ?CLINICAL DATA:  Four days of abdominal pain. EXAM: CT ABDOMEN AND PELVIS WITH CONTRAST TECHNIQUE: Multidetector CT imaging of the abdomen and pelvis was performed using the standard protocol following bolus administration of intravenous contrast. RADIATION DOSE REDUCTION: This exam was performed according to the departmental  dose-optimization program which includes automated exposure control, adjustment of the mA and/or kV according to patient size and/or use of iterative reconstruction technique. CONTRAST:  100mL OMNIPAQUE IOHEXOL 300 MG/ML  SOLN COMPARISON:  Jan 03, 2021 CT FINDINGS: Lower chest: No acute abnormality. Hepatobiliary: Hypodense 2.3 cm lesion in the inferior right lobe of the liver on image 36/9 demonstrates peripheral nodular discontinuous enhancement with some filling in of lesion on delayed imaging, stable in size dating back to 2022, most consistent with a benign hepatic hemangioma. Gallbladder is surgically absent. No biliary ductal dilation. Pancreas: No pancreatic ductal dilation or evidence of acute inflammation. Spleen: No splenomegaly or focal splenic lesion. Adrenals/Urinary Tract: Bilateral adrenal glands appear normal. No hydronephrosis. Kidneys demonstrate symmetric enhancement and excretion of contrast material. Urinary bladder is unremarkable for degree of distension. Stomach/Bowel: No enteric contrast was administered. Stomach is unremarkable for degree of distension. No pathologic dilation of small or large bowel. The appendix and terminal ileum appear normal. Sigmoid colonic diverticulosis with inflamed diverticulum, colonic wall thickening and adjacent inflammation involving a short loop of sigmoid colon. Vascular/Lymphatic: Normal caliber abdominal aorta. No pathologically enlarged abdominal or pelvic lymph nodes. Reproductive: Uterus is surgically absent. Right adnexa is unremarkable. Pelvic inflammation extends from the sigmoid diverticulitis to the left adnexa with prominence of the left ovary. Other: No walled off fluid collections.  No pneumoperitoneum. Musculoskeletal: No acute or significant osseous findings. IMPRESSION: Acute sigmoid diverticulitis without evidence of perforation or walled off fluid collection. Pelvic inflammation extends from the  sigmoid diverticulitis to the left adnexa  with prominence of the left ovary, a colo-ovarian fistula while rare is a possibility, would suggest further evaluation with pelvic ultrasound after completion of therapy. Electronically Signed   By: Christell Constant.D.

## 2021-11-03 NOTE — Assessment & Plan Note (Signed)
-  Incidental abnormalities of her left ovary ?-Case discussed with OB/GYN provider on-call (Dr. Despina Hidden) ?-Recommendations given for eventual pelvic ultrasound after treatment, can likely be followed outpatient / per OBGYN. ?-When discussed with general surgeons they said they will follow and repeat images and address any further abnormalities appreciated. ?

## 2021-11-03 NOTE — Assessment & Plan Note (Signed)
-  Low calorie diet, portion control and increase physical activity discussed with patient. ?-Body mass index is 34.01 kg/m?Marland Kitchen ? ?

## 2021-11-03 NOTE — TOC Progression Note (Signed)
? ?  Transition of Care (TOC) Screening Note ? ? ?Patient Details  ?Name: Laurie Brooks ?Date of Birth: 07-12-1966 ? ? ?Transition of Care (TOC) CM/SW Contact:    ?Leitha Bleak, RN ?Phone Number: ?11/03/2021, 11:49 AM ? ? ? ?Transition of Care Department [facility]) has reviewed patient and no TOC needs have been identified at this time. We will continue to monitor patient advancement through interdisciplinary progression rounds. If new patient transition needs arise, please place a TOC consult. ? ? ? ? ?  ?Barriers to Discharge: No Barriers Identified, Continued Medical Work up ? ?  ? ?

## 2023-01-09 IMAGING — CT CT ABD-PELV W/ CM
2 of 5 series · 16 of 46 positions shown, 18 images · IV contrast (Omnipaque or Isovue)
Comparison: January 03, 2021 CT

CLINICAL DATA: Four days of abdominal pain.

EXAM:
CT ABDOMEN AND PELVIS WITH CONTRAST
TECHNIQUE: Multidetector CT imaging of the abdomen and pelvis was performed
using the standard protocol following bolus administration of
intravenous contrast.

[Series 6: coronal st · coronal · 0.79mm/px · 3 of 112 slices shown]
[im 38/112  soft-tissue]
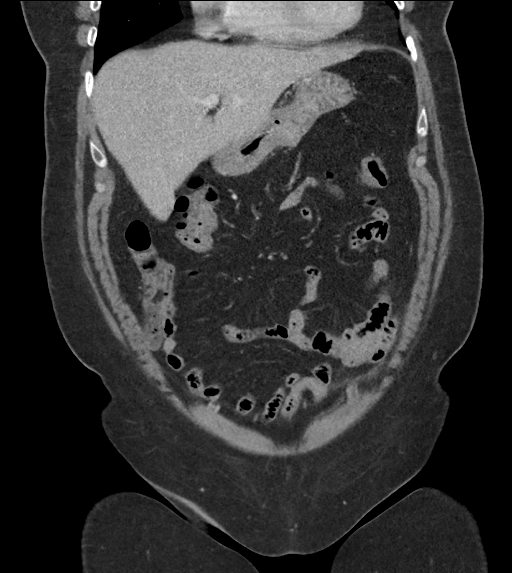
[im 50/112  soft-tissue]
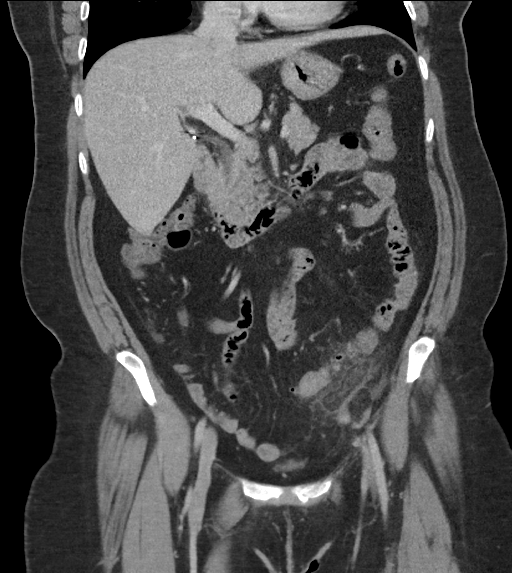
[im 62/112  soft-tissue]
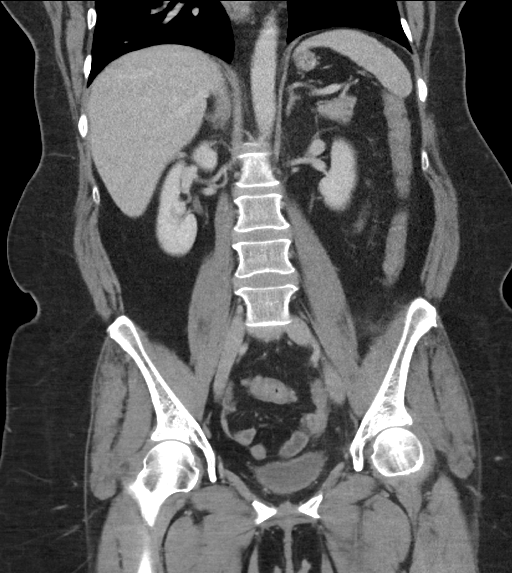

[Series 9: axial st · axial · 0.88mm/px · z∈[+1098,+1498]mm · 13 of 91 slices shown, 15 images]
[im 6/91  soft-tissue]
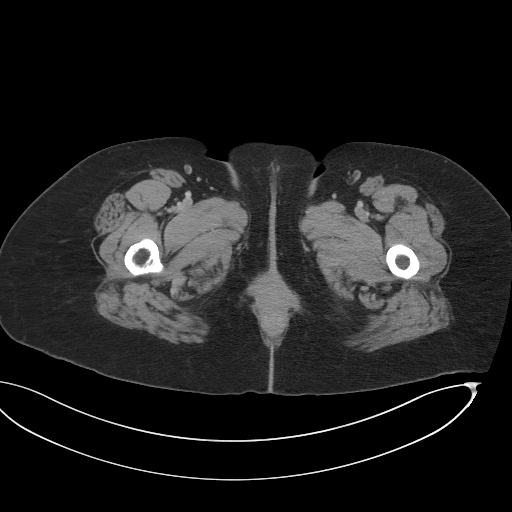
[im 6/91  bone]
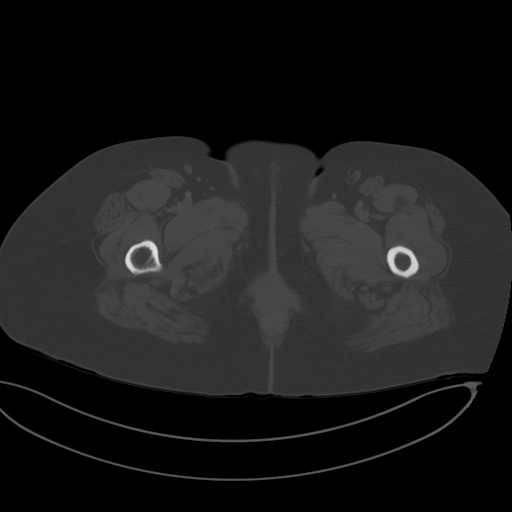
[im 11/91  soft-tissue]
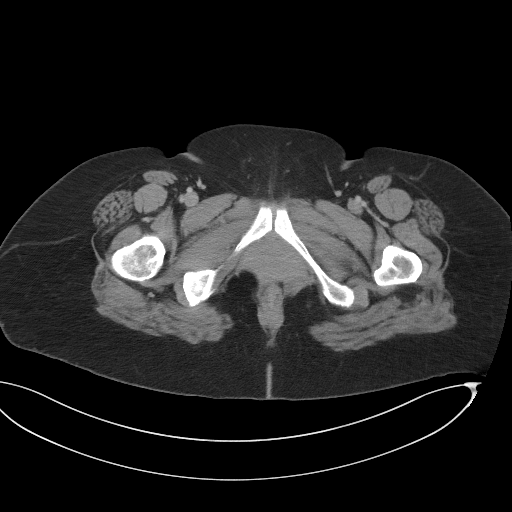
[im 21/91  soft-tissue]
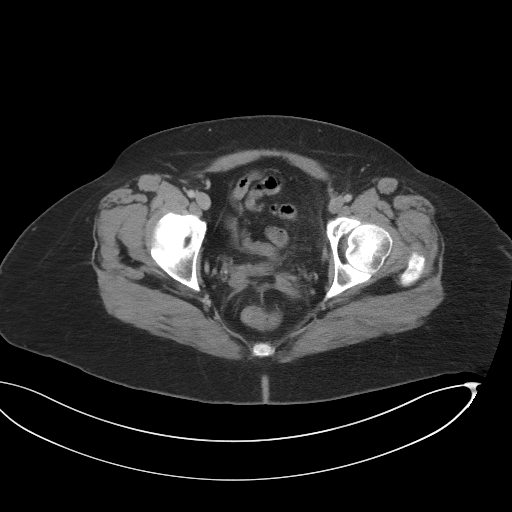
[im 26/91  soft-tissue]
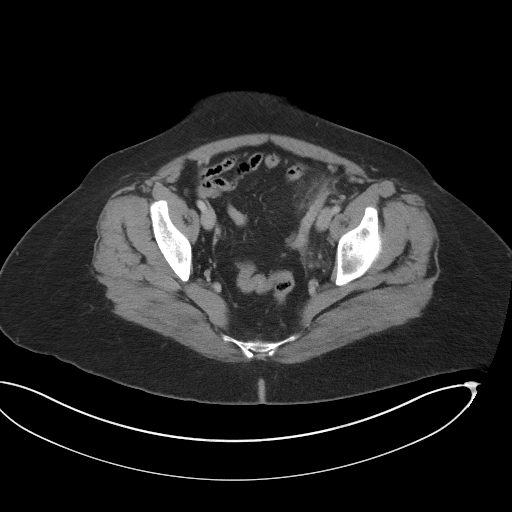
[im 31/91  soft-tissue]
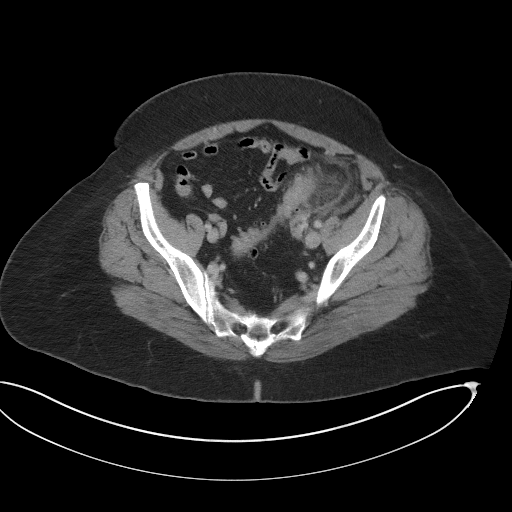
[im 41/91  soft-tissue]
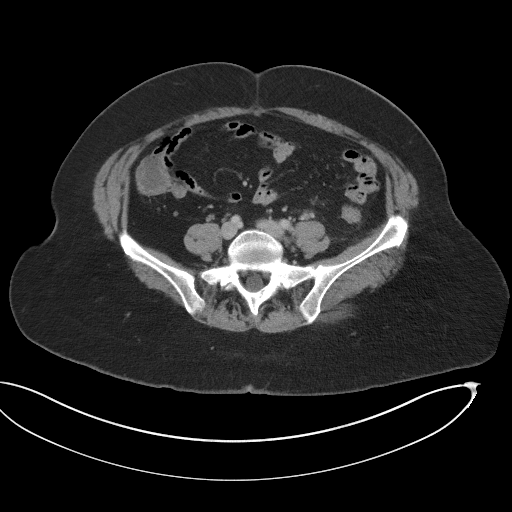
[im 46/91  soft-tissue]
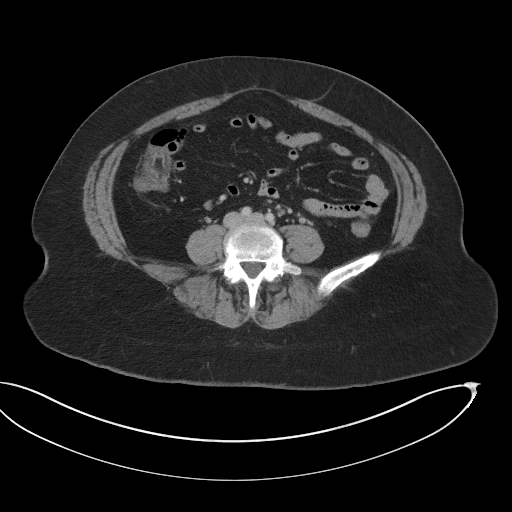
[im 51/91  soft-tissue]
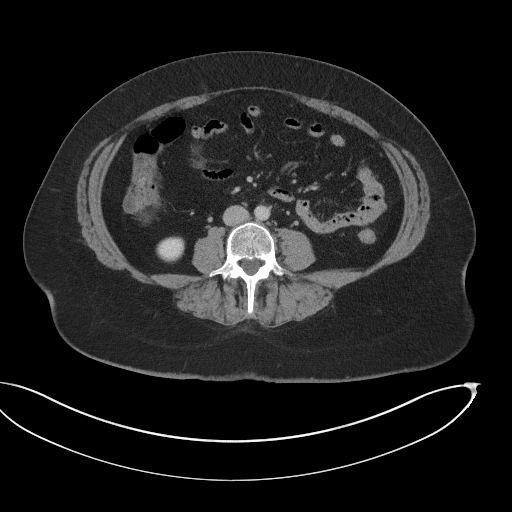
[im 61/91  soft-tissue]
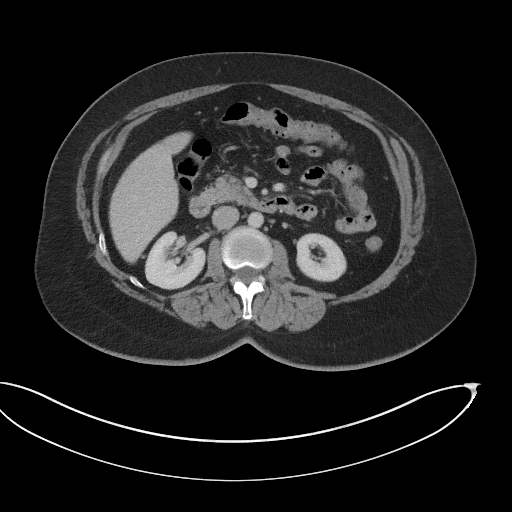
[im 61/91  bone]
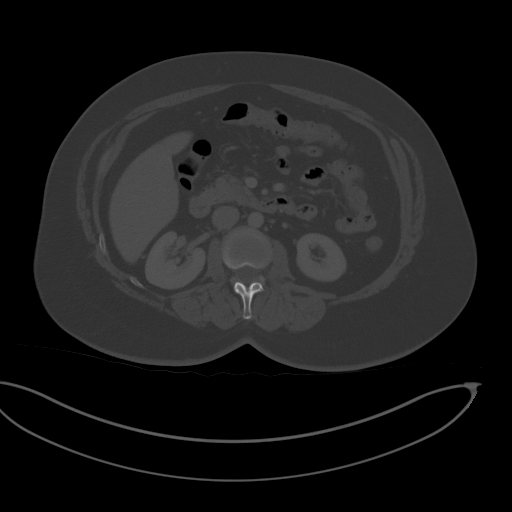
[im 66/91  soft-tissue]
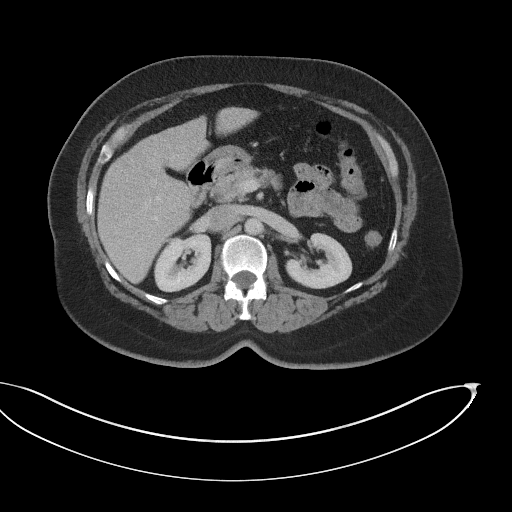
[im 71/91  soft-tissue]
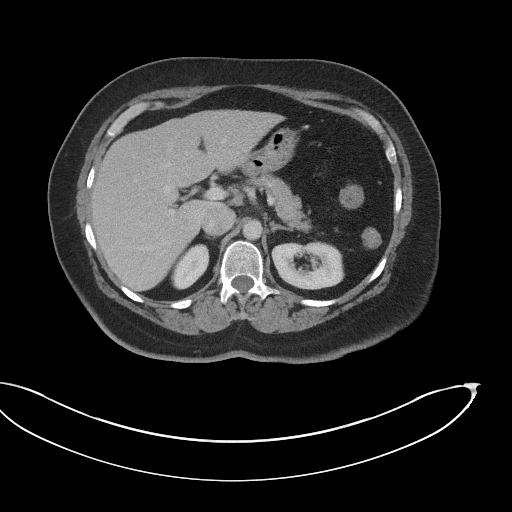
[im 81/91  soft-tissue]
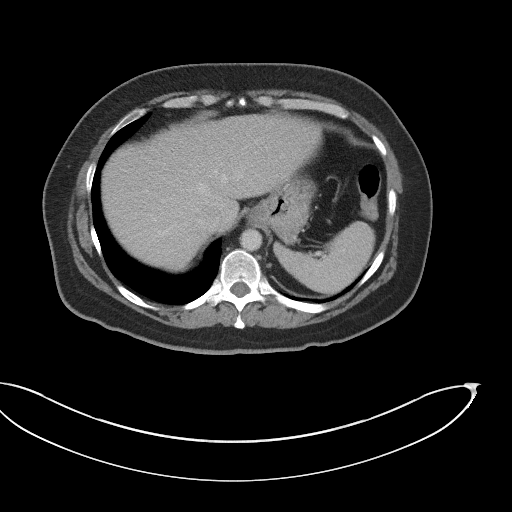
[im 86/91  soft-tissue]
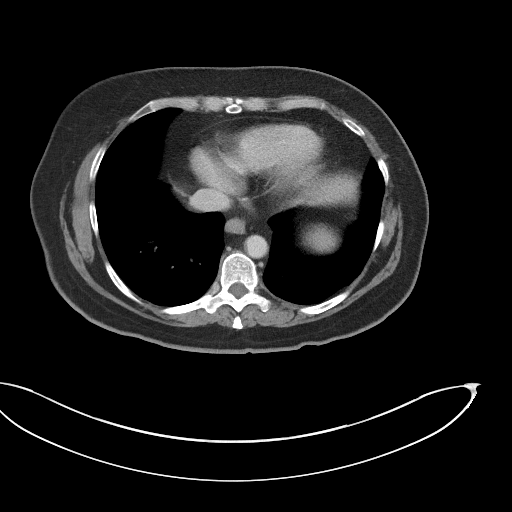

[16 of 46 positions shown; findings below may reference images not displayed]

RADIATION DOSE REDUCTION: This exam was performed according to the
departmental dose-optimization program which includes automated
exposure control, adjustment of the mA and/or kV according to
patient size and/or use of iterative reconstruction technique.

CONTRAST:  100mL OMNIPAQUE IOHEXOL 300 MG/ML  SOLN
FINDINGS: Lower chest: No acute abnormality.

Hepatobiliary: Hypodense 2.3 cm lesion in the inferior right lobe of
the liver on image 36/9 demonstrates peripheral nodular
discontinuous enhancement with some filling in of lesion on delayed
imaging, stable in size dating back to 0300, most consistent with a
benign hepatic hemangioma. Gallbladder is surgically absent. No
biliary ductal dilation.

Pancreas: No pancreatic ductal dilation or evidence of acute
inflammation.

Spleen: No splenomegaly or focal splenic lesion.

Adrenals/Urinary Tract: Bilateral adrenal glands appear normal. No
hydronephrosis. Kidneys demonstrate symmetric enhancement and
excretion of contrast material. Urinary bladder is unremarkable for
degree of distension.

Stomach/Bowel: No enteric contrast was administered. Stomach is
unremarkable for degree of distension. No pathologic dilation of
small or large bowel. The appendix and terminal ileum appear normal.
Sigmoid colonic diverticulosis with inflamed diverticulum, colonic
wall thickening and adjacent inflammation involving a short loop of
sigmoid colon.

Vascular/Lymphatic: Normal caliber abdominal aorta. No
pathologically enlarged abdominal or pelvic lymph nodes.

Reproductive: Uterus is surgically absent. Right adnexa is
unremarkable. Pelvic inflammation extends from the sigmoid
diverticulitis to the left adnexa with prominence of the left ovary.

Other: No walled off fluid collections.  No pneumoperitoneum.

Musculoskeletal: No acute or significant osseous findings.
IMPRESSION: Acute sigmoid diverticulitis without evidence of perforation or
walled off fluid collection.

Pelvic inflammation extends from the sigmoid diverticulitis to the
left adnexa with prominence of the left ovary, a colo-ovarian
fistula while rare is a possibility, would suggest further
evaluation with pelvic ultrasound after completion of therapy.
